# Patient Record
Sex: Male | Born: 1960 | Race: White | Hispanic: No | Marital: Married | Smoking: Never smoker
Health system: Southern US, Community
[De-identification: ages and names within clinical notes are randomized; demographics above are authoritative.]

## PROBLEM LIST (undated history)

## (undated) DIAGNOSIS — M1990 Unspecified osteoarthritis, unspecified site: Secondary | ICD-10-CM

## (undated) DIAGNOSIS — M545 Low back pain, unspecified: Secondary | ICD-10-CM

## (undated) DIAGNOSIS — I10 Essential (primary) hypertension: Secondary | ICD-10-CM

## (undated) DIAGNOSIS — H269 Unspecified cataract: Secondary | ICD-10-CM

## (undated) HISTORY — DX: Unspecified cataract: H26.9

## (undated) HISTORY — DX: Low back pain, unspecified: M54.50

## (undated) HISTORY — DX: Low back pain: M54.5

## (undated) HISTORY — DX: Essential (primary) hypertension: I10

## (undated) HISTORY — DX: Unspecified osteoarthritis, unspecified site: M19.90

---

## 1998-01-13 ENCOUNTER — Ambulatory Visit (HOSPITAL_COMMUNITY): Admission: RE | Admit: 1998-01-13 | Discharge: 1998-01-13 | Payer: Self-pay | Admitting: Internal Medicine

## 1998-01-13 ENCOUNTER — Encounter: Payer: Self-pay | Admitting: Internal Medicine

## 2004-09-15 ENCOUNTER — Ambulatory Visit: Payer: Self-pay | Admitting: Internal Medicine

## 2004-09-18 ENCOUNTER — Ambulatory Visit: Payer: Self-pay | Admitting: Internal Medicine

## 2005-06-15 ENCOUNTER — Ambulatory Visit: Payer: Self-pay | Admitting: Internal Medicine

## 2005-09-13 ENCOUNTER — Ambulatory Visit: Payer: Self-pay | Admitting: Internal Medicine

## 2005-12-14 ENCOUNTER — Ambulatory Visit: Payer: Self-pay | Admitting: Internal Medicine

## 2006-06-15 ENCOUNTER — Ambulatory Visit: Payer: Self-pay | Admitting: Internal Medicine

## 2006-06-15 LAB — CONVERTED CEMR LAB
ALT: 28 units/L (ref 0–40)
AST: 20 units/L (ref 0–37)
Albumin: 4.5 g/dL (ref 3.5–5.2)
Alkaline Phosphatase: 56 units/L (ref 39–117)
BUN: 23 mg/dL (ref 6–23)
Bilirubin, Direct: 0.1 mg/dL (ref 0.0–0.3)
CO2: 32 meq/L (ref 19–32)
Calcium: 9.4 mg/dL (ref 8.4–10.5)
Chloride: 106 meq/L (ref 96–112)
Creatinine, Ser: 0.8 mg/dL (ref 0.4–1.5)
GFR calc Af Amer: 135 mL/min
GFR calc non Af Amer: 112 mL/min
Glucose, Bld: 81 mg/dL (ref 70–99)
Potassium: 3.7 meq/L (ref 3.5–5.1)
Sodium: 141 meq/L (ref 135–145)
Total Bilirubin: 0.8 mg/dL (ref 0.3–1.2)
Total Protein: 7.6 g/dL (ref 6.0–8.3)
Vit D, 1,25-Dihydroxy: 58 — ABNORMAL HIGH (ref 20–57)

## 2006-12-10 ENCOUNTER — Encounter: Payer: Self-pay | Admitting: Internal Medicine

## 2007-01-16 ENCOUNTER — Ambulatory Visit: Payer: Self-pay | Admitting: Internal Medicine

## 2007-01-16 DIAGNOSIS — I10 Essential (primary) hypertension: Secondary | ICD-10-CM | POA: Insufficient documentation

## 2007-01-16 DIAGNOSIS — J302 Other seasonal allergic rhinitis: Secondary | ICD-10-CM | POA: Insufficient documentation

## 2007-01-16 DIAGNOSIS — J069 Acute upper respiratory infection, unspecified: Secondary | ICD-10-CM | POA: Insufficient documentation

## 2007-01-16 DIAGNOSIS — M1990 Unspecified osteoarthritis, unspecified site: Secondary | ICD-10-CM | POA: Insufficient documentation

## 2007-04-25 ENCOUNTER — Ambulatory Visit: Payer: Self-pay | Admitting: Internal Medicine

## 2007-07-11 ENCOUNTER — Ambulatory Visit: Payer: Self-pay | Admitting: Internal Medicine

## 2007-07-11 LAB — CONVERTED CEMR LAB
ALT: 19 units/L (ref 0–53)
AST: 17 units/L (ref 0–37)
Albumin: 4.5 g/dL (ref 3.5–5.2)
Alkaline Phosphatase: 52 units/L (ref 39–117)
BUN: 24 mg/dL — ABNORMAL HIGH (ref 6–23)
Bacteria, UA: NEGATIVE
Basophils Absolute: 0.1 10*3/uL (ref 0.0–0.1)
Basophils Relative: 1.1 % — ABNORMAL HIGH (ref 0.0–1.0)
Bilirubin Urine: NEGATIVE
Bilirubin, Direct: 0.1 mg/dL (ref 0.0–0.3)
CO2: 31 meq/L (ref 19–32)
Calcium: 9.6 mg/dL (ref 8.4–10.5)
Chloride: 106 meq/L (ref 96–112)
Cholesterol: 182 mg/dL (ref 0–200)
Creatinine, Ser: 1 mg/dL (ref 0.4–1.5)
Crystals: NEGATIVE
Direct LDL: 125.6 mg/dL
Eosinophils Absolute: 0.4 10*3/uL (ref 0.0–0.7)
Eosinophils Relative: 5.1 % — ABNORMAL HIGH (ref 0.0–5.0)
GFR calc Af Amer: 104 mL/min
GFR calc non Af Amer: 86 mL/min
Glucose, Bld: 114 mg/dL — ABNORMAL HIGH (ref 70–99)
HCT: 44.3 % (ref 39.0–52.0)
HDL: 32.5 mg/dL — ABNORMAL LOW (ref >39.0)
Hemoglobin, Urine: NEGATIVE
Hemoglobin: 15.3 g/dL (ref 13.0–17.0)
Ketones, ur: NEGATIVE mg/dL
Leukocytes, UA: NEGATIVE
Lymphocytes Relative: 30.4 % (ref 12.0–46.0)
MCHC: 34.6 g/dL (ref 30.0–36.0)
MCV: 88 fL (ref 78.0–100.0)
Monocytes Absolute: 0.7 10*3/uL (ref 0.1–1.0)
Monocytes Relative: 8.3 % (ref 3.0–12.0)
Mucus, UA: NEGATIVE
Neutro Abs: 4.6 10*3/uL (ref 1.4–7.7)
Neutrophils Relative %: 55.1 % (ref 43.0–77.0)
Nitrite: NEGATIVE
PSA: 1.02 ng/mL (ref 0.10–4.00)
Platelets: 254 10*3/uL (ref 150–400)
Potassium: 4.7 meq/L (ref 3.5–5.1)
RBC / HPF: NONE SEEN
RBC: 5.03 M/uL (ref 4.22–5.81)
RDW: 11.9 % (ref 11.5–14.6)
Sed Rate: 7 mm/hr (ref 0–16)
Sodium: 141 meq/L (ref 135–145)
Specific Gravity, Urine: >=1.03 (ref 1.000–1.03)
TSH: 1.18 microintl units/mL (ref 0.35–5.50)
Total Bilirubin: 0.9 mg/dL (ref 0.3–1.2)
Total CHOL/HDL Ratio: 5.6
Total Protein, Urine: NEGATIVE mg/dL
Total Protein: 7.5 g/dL (ref 6.0–8.3)
Triglycerides: 214 mg/dL (ref 0–149)
Urine Glucose: NEGATIVE mg/dL
Urobilinogen, UA: 0.2 (ref 0.0–1.0)
VLDL: 43 mg/dL — ABNORMAL HIGH (ref 0–40)
WBC: 8.4 10*3/uL (ref 4.5–10.5)
pH: 5.5 (ref 5.0–8.0)

## 2007-07-18 ENCOUNTER — Ambulatory Visit: Payer: Self-pay | Admitting: Internal Medicine

## 2007-07-18 DIAGNOSIS — R7309 Other abnormal glucose: Secondary | ICD-10-CM | POA: Insufficient documentation

## 2007-08-15 ENCOUNTER — Ambulatory Visit: Payer: Self-pay | Admitting: Internal Medicine

## 2007-08-15 DIAGNOSIS — J019 Acute sinusitis, unspecified: Secondary | ICD-10-CM | POA: Insufficient documentation

## 2008-01-15 ENCOUNTER — Ambulatory Visit: Payer: Self-pay | Admitting: Internal Medicine

## 2008-06-17 ENCOUNTER — Encounter: Payer: Self-pay | Admitting: Internal Medicine

## 2008-07-19 ENCOUNTER — Ambulatory Visit: Payer: Self-pay | Admitting: Internal Medicine

## 2008-07-19 LAB — CONVERTED CEMR LAB
ALT: 26 units/L (ref 0–53)
AST: 20 units/L (ref 0–37)
Albumin: 4.4 g/dL (ref 3.5–5.2)
Alkaline Phosphatase: 64 units/L (ref 39–117)
BUN: 26 mg/dL — ABNORMAL HIGH (ref 6–23)
Basophils Absolute: 0 10*3/uL (ref 0.0–0.1)
Basophils Relative: 0.3 % (ref 0.0–3.0)
Bilirubin Urine: NEGATIVE
Bilirubin, Direct: 0.1 mg/dL (ref 0.0–0.3)
CO2: 31 meq/L (ref 19–32)
Calcium: 9.4 mg/dL (ref 8.4–10.5)
Chloride: 108 meq/L (ref 96–112)
Cholesterol: 165 mg/dL (ref 0–200)
Creatinine, Ser: 1 mg/dL (ref 0.4–1.5)
Eosinophils Absolute: 0.4 10*3/uL (ref 0.0–0.7)
Eosinophils Relative: 5.6 % — ABNORMAL HIGH (ref 0.0–5.0)
GFR calc non Af Amer: 85.32 mL/min (ref >60)
Glucose, Bld: 90 mg/dL (ref 70–99)
HCT: 44.4 % (ref 39.0–52.0)
HDL: 35.8 mg/dL — ABNORMAL LOW (ref >39.00)
Hemoglobin, Urine: NEGATIVE
Hemoglobin: 15.6 g/dL (ref 13.0–17.0)
Ketones, ur: NEGATIVE mg/dL
LDL Cholesterol: 108 mg/dL — ABNORMAL HIGH (ref 0–99)
Leukocytes, UA: NEGATIVE
Lymphocytes Relative: 32.9 % (ref 12.0–46.0)
Lymphs Abs: 2.1 10*3/uL (ref 0.7–4.0)
MCHC: 35 g/dL (ref 30.0–36.0)
MCV: 88.2 fL (ref 78.0–100.0)
Monocytes Absolute: 0.6 10*3/uL (ref 0.1–1.0)
Monocytes Relative: 9.4 % (ref 3.0–12.0)
Neutro Abs: 3.4 10*3/uL (ref 1.4–7.7)
Neutrophils Relative %: 51.8 % (ref 43.0–77.0)
Nitrite: NEGATIVE
PSA: 0.76 ng/mL (ref 0.10–4.00)
Platelets: 242 10*3/uL (ref 150.0–400.0)
Potassium: 4.3 meq/L (ref 3.5–5.1)
RBC: 5.04 M/uL (ref 4.22–5.81)
RDW: 12.1 % (ref 11.5–14.6)
Sodium: 143 meq/L (ref 135–145)
Specific Gravity, Urine: >=1.03 (ref 1.000–1.030)
TSH: 0.55 microintl units/mL (ref 0.35–5.50)
Total Bilirubin: 1.1 mg/dL (ref 0.3–1.2)
Total CHOL/HDL Ratio: 5
Total Protein, Urine: NEGATIVE mg/dL
Total Protein: 7.3 g/dL (ref 6.0–8.3)
Triglycerides: 108 mg/dL (ref 0.0–149.0)
Urine Glucose: NEGATIVE mg/dL
Urobilinogen, UA: 0.2 (ref 0.0–1.0)
VLDL: 21.6 mg/dL (ref 0.0–40.0)
WBC: 6.5 10*3/uL (ref 4.5–10.5)
pH: 5 (ref 5.0–8.0)

## 2008-07-23 ENCOUNTER — Ambulatory Visit: Payer: Self-pay | Admitting: Internal Medicine

## 2009-01-28 ENCOUNTER — Ambulatory Visit: Payer: Self-pay | Admitting: Internal Medicine

## 2009-01-28 DIAGNOSIS — M545 Low back pain, unspecified: Secondary | ICD-10-CM | POA: Insufficient documentation

## 2009-07-30 ENCOUNTER — Ambulatory Visit: Payer: Self-pay | Admitting: Internal Medicine

## 2009-07-30 LAB — CONVERTED CEMR LAB
ALT: 23 units/L (ref 0–53)
AST: 20 units/L (ref 0–37)
Albumin: 4.7 g/dL (ref 3.5–5.2)
Alkaline Phosphatase: 55 units/L (ref 39–117)
BUN: 32 mg/dL — ABNORMAL HIGH (ref 6–23)
Basophils Absolute: 0 10*3/uL (ref 0.0–0.1)
Basophils Relative: 0.5 % (ref 0.0–3.0)
Bilirubin Urine: NEGATIVE
Bilirubin, Direct: 0.1 mg/dL (ref 0.0–0.3)
CO2: 29 meq/L (ref 19–32)
Calcium: 9.8 mg/dL (ref 8.4–10.5)
Chloride: 106 meq/L (ref 96–112)
Cholesterol: 173 mg/dL (ref 0–200)
Creatinine, Ser: 1 mg/dL (ref 0.4–1.5)
Eosinophils Absolute: 0.3 10*3/uL (ref 0.0–0.7)
Eosinophils Relative: 3.3 % (ref 0.0–5.0)
GFR calc non Af Amer: 87.98 mL/min (ref >60)
Glucose, Bld: 77 mg/dL (ref 70–99)
HCT: 45.1 % (ref 39.0–52.0)
HDL: 38.7 mg/dL — ABNORMAL LOW (ref >39.00)
Hemoglobin, Urine: NEGATIVE
Hemoglobin: 15.7 g/dL (ref 13.0–17.0)
Ketones, ur: NEGATIVE mg/dL
LDL Cholesterol: 106 mg/dL — ABNORMAL HIGH (ref 0–99)
Leukocytes, UA: NEGATIVE
Lymphocytes Relative: 28.4 % (ref 12.0–46.0)
Lymphs Abs: 2.4 10*3/uL (ref 0.7–4.0)
MCHC: 34.8 g/dL (ref 30.0–36.0)
MCV: 88.7 fL (ref 78.0–100.0)
Monocytes Absolute: 0.8 10*3/uL (ref 0.1–1.0)
Monocytes Relative: 9 % (ref 3.0–12.0)
Neutro Abs: 5.1 10*3/uL (ref 1.4–7.7)
Neutrophils Relative %: 58.8 % (ref 43.0–77.0)
Nitrite: NEGATIVE
PSA: 1.19 ng/mL (ref 0.10–4.00)
Platelets: 265 10*3/uL (ref 150.0–400.0)
Potassium: 4.5 meq/L (ref 3.5–5.1)
RBC: 5.08 M/uL (ref 4.22–5.81)
RDW: 13.1 % (ref 11.5–14.6)
Sodium: 142 meq/L (ref 135–145)
Specific Gravity, Urine: >=1.03 (ref 1.000–1.030)
TSH: 0.88 microintl units/mL (ref 0.35–5.50)
Total Bilirubin: 0.9 mg/dL (ref 0.3–1.2)
Total CHOL/HDL Ratio: 4
Total Protein, Urine: NEGATIVE mg/dL
Total Protein: 7.7 g/dL (ref 6.0–8.3)
Triglycerides: 140 mg/dL (ref 0.0–149.0)
Urine Glucose: NEGATIVE mg/dL
Urobilinogen, UA: 0.2 (ref 0.0–1.0)
VLDL: 28 mg/dL (ref 0.0–40.0)
WBC: 8.6 10*3/uL (ref 4.5–10.5)
pH: 5.5 (ref 5.0–8.0)

## 2009-08-05 ENCOUNTER — Ambulatory Visit: Payer: Self-pay | Admitting: Internal Medicine

## 2009-08-05 DIAGNOSIS — R5383 Other fatigue: Secondary | ICD-10-CM

## 2009-08-05 DIAGNOSIS — R5381 Other malaise: Secondary | ICD-10-CM | POA: Insufficient documentation

## 2010-02-05 ENCOUNTER — Ambulatory Visit: Payer: Self-pay | Admitting: Internal Medicine

## 2010-03-08 HISTORY — PX: EYE SURGERY: SHX253

## 2010-04-07 NOTE — Assessment & Plan Note (Signed)
Summary: 6 mos f/u // #/cd   Vital Signs:  Patient profile:   50 year old male Height:      67 inches Weight:      176 pounds BMI:     27.67 Temp:     98.8 degrees F oral Pulse rate:   80 / minute Pulse rhythm:   regular Resp:     16 per minute BP sitting:   130 / 92  (left arm) Cuff size:   regular  Vitals Entered By: Lanier Prude, CMA(AAMA) (February 05, 2010 8:26 AM) CC: 6 mo f/u  Is Patient Diabetic? No   CC:  6 mo f/u .  History of Present Illness: The patient presents for a follow up of hypertension, OA, LBP  Current Medications (verified): 1)  Tramadol Hcl 50 Mg  Tabs (Tramadol Hcl) .Marland Kitchen.. 1 or 2 Two Times A Day  As Needed 2)  Naprosyn 500 Mg Tabs (Naproxen) .Marland Kitchen.. 1 Two Times A Day Pc Prn 3)  Accupril 10 Mg Tabs (Quinapril Hcl) .Marland Kitchen.. 1 By Mouth Qd 4)  Vitamin D3 1000 Unit  Tabs (Cholecalciferol) .Marland Kitchen.. 1 By Mouth Daily  Allergies (verified): No Known Drug Allergies  Past History:  Past Surgical History: Last updated: 01/16/2007 None  Social History: Last updated: 07/18/2007 Occupation:  Married Never Smoked  Past Medical History:  Allergic rhinitis Hypertension Osteoarthritis Low back pain  Review of Systems  The patient denies fever, dyspnea on exertion, and abdominal pain.    Physical Exam  General:  Well-developed,well-nourished,in no acute distress; alert,appropriate and cooperative throughout examination Nose:  External nasal examination shows no deformity or inflammation. Nasal mucosa are pink and moist without lesions or exudates. Mouth:  Oral mucosa and oropharynx without lesions or exudates.  Teeth in good repair. Neck:  No deformities, masses, or tenderness noted. Lungs:  Normal respiratory effort, chest expands symmetrically. Lungs are clear to auscultation, no crackles or wheezes. Heart:  Normal rate and regular rhythm. S1 and S2 normal without gallop, murmur, click, rub or other extra sounds. Abdomen:  Bowel sounds positive,abdomen soft  and non-tender without masses, organomegaly or hernias noted. Msk:  No deformity or scoliosis noted of thoracic or lumbar spine.  Hands w/o OA deformities Lumbar-sacral spine is a little tender to palpation over paraspinal muscles and painfull with the ROM  Extremities:  No clubbing, cyanosis, edema, or deformity noted with normal full range of motion of all joints.   Neurologic:  No cranial nerve deficits noted. Station and gait are normal. Plantar reflexes are down-going bilaterally. DTRs are symmetrical throughout. Sensory, motor and coordinative functions appear intact. Skin:  Intact without suspicious lesions or rashes Psych:  Cognition and judgment appear intact. Alert and cooperative with normal attention span and concentration. No apparent delusions, illusions, hallucinations   Impression & Recommendations:  Problem # 1:  LOW BACK PAIN (ICD-724.2) Assessment Unchanged  His updated medication list for this problem includes:    Tramadol Hcl 50 Mg Tabs (Tramadol hcl) .Marland Kitchen... 1 or 2 two times a day  as needed    Naprosyn 500 Mg Tabs (Naproxen) .Marland Kitchen... 1 two times a day pc prn  Problem # 2:  OSTEOARTHRITIS (ICD-715.90) Assessment: Unchanged  His updated medication list for this problem includes:    Tramadol Hcl 50 Mg Tabs (Tramadol hcl) .Marland Kitchen... 1 or 2 two times a day  as needed    Naprosyn 500 Mg Tabs (Naproxen) .Marland Kitchen... 1 two times a day pc prn  Problem # 3:  HYPERTENSION (  ICD-401.9) - needs better control Assessment: Unchanged  The following medications were removed from the medication list:    Accupril 10 Mg Tabs (Quinapril hcl) .Marland Kitchen... 1 by mouth qd His updated medication list for this problem includes:    Accupril 20 Mg Tabs (Quinapril hcl) .Marland Kitchen... 1 by mouth qd  Complete Medication List: 1)  Tramadol Hcl 50 Mg Tabs (Tramadol hcl) .Marland Kitchen.. 1 or 2 two times a day  as needed 2)  Naprosyn 500 Mg Tabs (Naproxen) .Marland Kitchen.. 1 two times a day pc prn 3)  Vitamin D3 1000 Unit Tabs (Cholecalciferol) .Marland Kitchen..  1 by mouth daily 4)  Accupril 20 Mg Tabs (Quinapril hcl) .Marland Kitchen.. 1 by mouth qd  Patient Instructions: 1)  Please schedule a follow-up appointment in 6 months well w/labs. Prescriptions: NAPROSYN 500 MG TABS (NAPROXEN) 1 two times a day pc prn  #60 x 6   Entered and Authorized by:   Tresa Garter MD   Signed by:   Tresa Garter MD on 02/05/2010   Method used:   Print then Give to Patient   RxID:   [id number] TRAMADOL HCL 50 MG  TABS (TRAMADOL HCL) 1 or 2 two times a day  as needed  #120 x 6   Entered and Authorized by:   Tresa Garter MD   Signed by:   Tresa Garter MD on 02/05/2010   Method used:   Print then Give to Patient   RxID:   [id number] ACCUPRIL 20 MG TABS (QUINAPRIL HCL) 1 by mouth qd  #30 x 11   Entered and Authorized by:   Tresa Garter MD   Signed by:   Tresa Garter MD on 02/05/2010   Method used:   Print then Give to Patient   RxID:   [id number]    Orders Added: 1)  Est. Patient Level IV [01027]   Immunization History:  Tetanus/Td Immunization History:    Tetanus/Td:  historical (11/18/1999)   Immunization History:  Tetanus/Td Immunization History:    Tetanus/Td:  Historical (11/18/1999)

## 2010-04-07 NOTE — Assessment & Plan Note (Signed)
Summary: CPX / NWS  #   Vital Signs:  Patient profile:   50 year old male Height:      67 inches Weight:      1760.50 pounds BMI:     276.73 O2 Sat:      98 % on Room air Temp:     97.1 degrees F oral Pulse rate:   91 / minute BP sitting:   140 / 88  (left arm) Cuff size:   regular  Vitals Entered By: Lucious Groves (Aug 05, 2009 8:22 AM)  O2 Flow:  Room air CC: CPX./kb Is Patient Diabetic? No Pain Assessment Patient in pain? no      Comments Patient declined EKG and TD booster./kb   CC:  CPX./kb.  History of Present Illness: The patient presents for a wellness examination  F/u OA, stiff back C/o fatigue  Current Medications (verified): 1)  Tramadol Hcl 50 Mg  Tabs (Tramadol Hcl) .Marland Kitchen.. 1 or 2 Two Times A Day  As Needed 2)  Naprosyn 500 Mg Tabs (Naproxen) .Marland Kitchen.. 1 Two Times A Day Pc Prn 3)  Accupril 10 Mg Tabs (Quinapril Hcl) .Marland Kitchen.. 1 By Mouth Qd 4)  Vitamin D3 1000 Unit  Tabs (Cholecalciferol) .Marland Kitchen.. 1 By Mouth Daily  Allergies (verified): No Known Drug Allergies  Past History:  Past Medical History: Last updated: 01/28/2009   Allergic rhinitis Hypertension Osteoarthritis Low back pain  Past Surgical History: Last updated: 01/16/2007 None  Family History: Last updated: 01/16/2007 Family History Hypertension Family History Diabetes 1st degree relative  Social History: Last updated: 07/18/2007 Occupation:  Married Never Smoked  Review of Systems  The patient denies anorexia, fever, weight loss, weight gain, vision loss, decreased hearing, hoarseness, chest pain, syncope, dyspnea on exertion, peripheral edema, prolonged cough, headaches, hemoptysis, abdominal pain, melena, hematochezia, severe indigestion/heartburn, hematuria, incontinence, genital sores, muscle weakness, suspicious skin lesions, transient blindness, difficulty walking, depression, unusual weight change, abnormal bleeding, enlarged lymph nodes, angioedema, and testicular masses.     LBP, fatigue   Physical Exam  General:  Well-developed,well-nourished,in no acute distress; alert,appropriate and cooperative throughout examination Head:  Normocephalic and atraumatic without obvious abnormalities. No apparent alopecia or balding. Eyes:  No corneal or conjunctival inflammation noted. EOMI. Perrla. Ears:  External ear exam shows no significant lesions or deformities.  Otoscopic examination reveals clear canals, tympanic membranes are intact bilaterally without bulging, retraction, inflammation or discharge. Hearing is grossly normal bilaterally. Nose:  External nasal examination shows no deformity or inflammation. Nasal mucosa are pink and moist without lesions or exudates. Mouth:  Oral mucosa and oropharynx without lesions or exudates.  Teeth in good repair. Neck:  No deformities, masses, or tenderness noted. Chest Wall:  No deformities, masses, tenderness or gynecomastia noted. Lungs:  Normal respiratory effort, chest expands symmetrically. Lungs are clear to auscultation, no crackles or wheezes. Heart:  Normal rate and regular rhythm. S1 and S2 normal without gallop, murmur, click, rub or other extra sounds. Abdomen:  Bowel sounds positive,abdomen soft and non-tender without masses, organomegaly or hernias noted. Genitalia:  declined - nl per pt Msk:  No deformity or scoliosis noted of thoracic or lumbar spine.  Hands w/o OA deformities Lumbar-sacral spine is a little tender to palpation over paraspinal muscles and painfull with the ROM  Pulses:  R and L carotid,radial,femoral,dorsalis pedis and posterior tibial pulses are full and equal bilaterally Extremities:  No clubbing, cyanosis, edema, or deformity noted with normal full range of motion of all joints.   Neurologic:  No cranial nerve deficits noted. Station and gait are normal. Plantar reflexes are down-going bilaterally. DTRs are symmetrical throughout. Sensory, motor and coordinative functions appear intact. Skin:   Intact without suspicious lesions or rashes Cervical Nodes:  No lymphadenopathy noted Inguinal Nodes:  No significant adenopathy   Impression & Recommendations:  Problem # 1:  PHYSICAL EXAMINATION (ICD-V70.0) Assessment New Health and age related issues were discussed. Available screening tests and vaccinations were discussed as well. Healthy life style including good diet and execise was discussed.  The labs were reviewed with the patient.  Declined EKG, shots  Problem # 2:  FATIGUE (ICD-780.79) Assessment: New Try to take Accupril at hs or hold  Problem # 3:  OSTEOARTHRITIS (ICD-715.90) Assessment: Unchanged  His updated medication list for this problem includes:    Tramadol Hcl 50 Mg Tabs (Tramadol hcl) .Marland Kitchen... 1 or 2 two times a day  as needed    Naprosyn 500 Mg Tabs (Naproxen) .Marland Kitchen... 1 two times a day pc prn  Problem # 4:  LOW BACK PAIN (ICD-724.2) Assessment: Unchanged  His updated medication list for this problem includes:    Tramadol Hcl 50 Mg Tabs (Tramadol hcl) .Marland Kitchen... 1 or 2 two times a day  as needed    Naprosyn 500 Mg Tabs (Naproxen) .Marland Kitchen... 1 two times a day pc prn  Complete Medication List: 1)  Tramadol Hcl 50 Mg Tabs (Tramadol hcl) .Marland Kitchen.. 1 or 2 two times a day  as needed 2)  Naprosyn 500 Mg Tabs (Naproxen) .Marland Kitchen.. 1 two times a day pc prn 3)  Accupril 10 Mg Tabs (Quinapril hcl) .Marland Kitchen.. 1 by mouth qd 4)  Vitamin D3 1000 Unit Tabs (Cholecalciferol) .Marland Kitchen.. 1 by mouth daily  Patient Instructions: 1)  Use stretching and balance exercises that I have provided (15 min. or longer every day) 2)  Foam cilinder, yoga mat 3)  Please schedule a follow-up appointment in 6 months.  Contraindications/Deferment of Procedures/Staging:    Test/Procedure: TD vaccine    Reason for deferment: declined  Prescriptions: ACCUPRIL 10 MG TABS (QUINAPRIL HCL) 1 by mouth qd  #30 x 12   Entered and Authorized by:   Tresa Garter MD   Signed by:   Tresa Garter MD on 08/05/2009   Method  used:   Electronically to        [facility]  [address] [phone]* (retail)       [address]       [place], [place]  [address]       Ph: [phone] or [phone]       Fax: [phone]   RxID:   [id number] NAPROSYN 500 MG TABS (NAPROXEN) 1 two times a day pc prn  #60 x 6   Entered and Authorized by:   Tresa Garter MD   Signed by:   Tresa Garter MD on 08/05/2009   Method used:   Electronically to        [facility]  [address] [phone]* (retail)       [address]       [place], [place]  [address]       Ph: [phone] or [phone]       Fax: [phone]   RxID:   [id number] TRAMADOL HCL 50 MG  TABS (TRAMADOL HCL) 1 or 2 two times a day  as needed  #120 x 6  Entered and Authorized by:   Tresa Garter MD   Signed by:   Tresa Garter MD on 08/05/2009   Method used:   Electronically to        [facility]  [address] [phone]* (retail)       [address]       [place], [place]  [address]       Ph: [phone] or [phone]       Fax: [phone]   RxID:   [id number]    s

## 2010-08-04 ENCOUNTER — Other Ambulatory Visit: Payer: Self-pay | Admitting: Internal Medicine

## 2010-08-04 ENCOUNTER — Other Ambulatory Visit (INDEPENDENT_AMBULATORY_CARE_PROVIDER_SITE_OTHER): Payer: BC Managed Care – PPO

## 2010-08-04 DIAGNOSIS — Z0000 Encounter for general adult medical examination without abnormal findings: Secondary | ICD-10-CM

## 2010-08-04 LAB — LIPID PANEL
HDL: 42.9 mg/dL (ref >39.00)
Total CHOL/HDL Ratio: 4
Triglycerides: 132 mg/dL (ref 0.0–149.0)
VLDL: 26.4 mg/dL (ref 0.0–40.0)

## 2010-08-04 LAB — URINALYSIS
Bilirubin Urine: NEGATIVE
Hgb urine dipstick: NEGATIVE
Ketones, ur: NEGATIVE
Leukocytes, UA: NEGATIVE
Nitrite: NEGATIVE
pH: 5.5 (ref 5.0–8.0)

## 2010-08-04 LAB — BASIC METABOLIC PANEL
BUN: 26 mg/dL — ABNORMAL HIGH (ref 6–23)
CO2: 28 mEq/L (ref 19–32)
Calcium: 9.6 mg/dL (ref 8.4–10.5)
GFR: 81.74 mL/min (ref >60.00)
Glucose, Bld: 91 mg/dL (ref 70–99)
Potassium: 4.7 mEq/L (ref 3.5–5.1)
Sodium: 140 mEq/L (ref 135–145)

## 2010-08-04 LAB — CBC WITH DIFFERENTIAL/PLATELET
Basophils Absolute: 0 10*3/uL (ref 0.0–0.1)
Eosinophils Absolute: 0.7 10*3/uL (ref 0.0–0.7)
Lymphocytes Relative: 26.6 % (ref 12.0–46.0)
MCHC: 34.3 g/dL (ref 30.0–36.0)
Monocytes Relative: 7.6 % (ref 3.0–12.0)
Neutrophils Relative %: 58.1 % (ref 43.0–77.0)
RBC: 5.26 Mil/uL (ref 4.22–5.81)
RDW: 13.3 % (ref 11.5–14.6)

## 2010-08-04 LAB — HEPATIC FUNCTION PANEL
AST: 17 U/L (ref 0–37)
Albumin: 4.3 g/dL (ref 3.5–5.2)
Alkaline Phosphatase: 50 U/L (ref 39–117)
Total Protein: 7 g/dL (ref 6.0–8.3)

## 2010-08-04 LAB — TSH: TSH: 0.98 u[IU]/mL (ref 0.35–5.50)

## 2010-08-07 ENCOUNTER — Encounter: Payer: Self-pay | Admitting: Internal Medicine

## 2010-08-10 ENCOUNTER — Encounter: Payer: Self-pay | Admitting: Internal Medicine

## 2010-08-10 ENCOUNTER — Ambulatory Visit (INDEPENDENT_AMBULATORY_CARE_PROVIDER_SITE_OTHER): Payer: BC Managed Care – PPO | Admitting: Internal Medicine

## 2010-08-10 VITALS — BP 130/90 | HR 92 | Resp 16 | Ht 67.0 in | Wt 176.0 lb

## 2010-08-10 DIAGNOSIS — Z0000 Encounter for general adult medical examination without abnormal findings: Secondary | ICD-10-CM

## 2010-08-10 DIAGNOSIS — I10 Essential (primary) hypertension: Secondary | ICD-10-CM

## 2010-08-10 MED ORDER — QUINAPRIL-HYDROCHLOROTHIAZIDE 20-12.5 MG PO TABS: 1.0000 | ORAL_TABLET | Freq: Every day | ORAL | Status: DC

## 2010-08-10 MED ORDER — TRAMADOL HCL 50 MG PO TABS: 50.0000 mg | ORAL_TABLET | Freq: Two times a day (BID) | ORAL | Status: DC | PRN

## 2010-08-10 MED ORDER — NAPROXEN 500 MG PO TABS: 500.0000 mg | ORAL_TABLET | Freq: Two times a day (BID) | ORAL | Status: DC

## 2010-08-10 NOTE — Progress Notes (Signed)
  Subjective:    Patient ID: Jeremy Johns, male    DOB: [birth year], 50 y.o.   MRN: [id number]  HPI   The patient is here for a wellness exam. The patient has been doing well overall without major physical or psychological issues going on lately. Review of Systems  Constitutional: Negative for appetite change, fatigue and unexpected weight change.  HENT: Negative for nosebleeds, congestion, sore throat, sneezing, trouble swallowing and neck pain.   Eyes: Negative for itching and visual disturbance.  Respiratory: Negative for cough.   Cardiovascular: Negative for chest pain, palpitations and leg swelling.  Gastrointestinal: Negative for nausea, diarrhea, blood in stool and abdominal distention.  Genitourinary: Negative for frequency and hematuria.  Musculoskeletal: Positive for back pain and arthralgias. Negative for joint swelling and gait problem.  Skin: Negative for rash.  Neurological: Negative for dizziness, tremors, speech difficulty and weakness.  Psychiatric/Behavioral: Negative for sleep disturbance, dysphoric mood and agitation. The patient is not nervous/anxious.    BP Readings from Last 3 Encounters:  08/10/10 130/90  02/05/10 130/92  08/05/09 140/88       Objective:   Physical Exam  Constitutional: He is oriented to person, place, and time. He appears well-developed and well-nourished. No distress.  HENT:  Head: Normocephalic and atraumatic.  Right Ear: External ear normal.  Left Ear: External ear normal.  Nose: Nose normal.  Mouth/Throat: Oropharynx is clear and moist. No oropharyngeal exudate.  Eyes: Conjunctivae and EOM are normal. Pupils are equal, round, and reactive to light. Right eye exhibits no discharge. Left eye exhibits no discharge. No scleral icterus.  Neck: Normal range of motion. Neck supple. No JVD present. No tracheal deviation present. No thyromegaly present.  Cardiovascular: Normal rate, regular rhythm, normal heart sounds and intact distal  pulses.  Exam reveals no gallop and no friction rub.   No murmur heard. Pulmonary/Chest: Effort normal and breath sounds normal. No stridor. No respiratory distress. He has no wheezes. He has no rales. He exhibits no tenderness.  Abdominal: Soft. Bowel sounds are normal. He exhibits no distension and no mass. There is no tenderness. There is no rebound and no guarding.  Genitourinary: Penis normal. Guaiac negative stool. No penile tenderness.  Musculoskeletal: Normal range of motion. He exhibits no edema and no tenderness.  Lymphadenopathy:    He has no cervical adenopathy.  Neurological: He is alert and oriented to person, place, and time. He has normal reflexes. No cranial nerve deficit. He exhibits normal muscle tone. Coordination normal.  Skin: Skin is warm and dry. No rash noted. He is not diaphoretic. No erythema. No pallor.  Psychiatric: He has a normal mood and affect. His behavior is normal. Judgment and thought content normal.         Lab Results  Component Value Date   WBC 9.5 08/04/2010   HGB 16.1 08/04/2010   HCT 46.9 08/04/2010   PLT 262.0 08/04/2010   CHOL 178 08/04/2010   TRIG 132.0 08/04/2010   HDL 42.90 08/04/2010   LDLDIRECT 125.6 07/11/2007   ALT 20 08/04/2010   AST 17 08/04/2010   NA 140 08/04/2010   K 4.7 08/04/2010   CL 106 08/04/2010   CREATININE 1.0 08/04/2010   BUN 26* 08/04/2010   CO2 28 08/04/2010   TSH 0.98 08/04/2010   PSA 1.19 07/30/2009    Assessment & Plan:

## 2010-08-10 NOTE — Assessment & Plan Note (Signed)
We discussed age appropriate health related issues, including available/recomended screening tests and vaccinations. We discussed a need for adhering to healthy diet and exercise. Labs/EKG were reviewed/ordered. All questions were answered.   

## 2011-02-02 ENCOUNTER — Other Ambulatory Visit (INDEPENDENT_AMBULATORY_CARE_PROVIDER_SITE_OTHER): Payer: BC Managed Care – PPO

## 2011-02-02 DIAGNOSIS — I10 Essential (primary) hypertension: Secondary | ICD-10-CM

## 2011-02-02 LAB — COMPREHENSIVE METABOLIC PANEL
ALT: 37 U/L (ref 0–53)
AST: 27 U/L (ref 0–37)
Albumin: 4.4 g/dL (ref 3.5–5.2)
CO2: 28 mEq/L (ref 19–32)
Calcium: 9.7 mg/dL (ref 8.4–10.5)
Chloride: 103 mEq/L (ref 96–112)
GFR: 81.57 mL/min (ref >60.00)
Potassium: 3.8 mEq/L (ref 3.5–5.1)
Total Protein: 7.9 g/dL (ref 6.0–8.3)

## 2011-02-08 ENCOUNTER — Ambulatory Visit (INDEPENDENT_AMBULATORY_CARE_PROVIDER_SITE_OTHER): Payer: Managed Care, Other (non HMO) | Admitting: Internal Medicine

## 2011-02-08 ENCOUNTER — Encounter: Payer: Self-pay | Admitting: Internal Medicine

## 2011-02-08 DIAGNOSIS — M1990 Unspecified osteoarthritis, unspecified site: Secondary | ICD-10-CM

## 2011-02-08 DIAGNOSIS — I10 Essential (primary) hypertension: Secondary | ICD-10-CM

## 2011-02-08 DIAGNOSIS — M545 Low back pain, unspecified: Secondary | ICD-10-CM

## 2011-02-08 MED ORDER — TRAMADOL HCL 50 MG PO TABS: 50.0000 mg | ORAL_TABLET | Freq: Two times a day (BID) | ORAL | Status: DC | PRN

## 2011-02-08 MED ORDER — QUINAPRIL-HYDROCHLOROTHIAZIDE 20-12.5 MG PO TABS: 1.0000 | ORAL_TABLET | Freq: Every day | ORAL | Status: DC

## 2011-02-08 NOTE — Assessment & Plan Note (Signed)
Continue with current prescription therapy as reflected on the Med list.  

## 2011-02-08 NOTE — Progress Notes (Signed)
  Subjective:    Patient ID: Jeremy Johns, male    DOB: 1960-08-29, 50 y.o.   MRN: [id number]  HPI  The patient presents for a follow-up of  chronic hypertension, OA, LBP controlled with medicines    Review of Systems  Constitutional: Negative for chills.  Respiratory: Negative for shortness of breath.   Musculoskeletal: Positive for back pain and arthralgias. Negative for gait problem.  Skin: Negative for rash.  Neurological: Negative for numbness.  Psychiatric/Behavioral: Negative for confusion. The patient is not nervous/anxious.        Objective:   Physical Exam  Constitutional: He is oriented to person, place, and time. He appears well-developed.  HENT:  Mouth/Throat: Oropharynx is clear and moist.  Eyes: Conjunctivae are normal. Pupils are equal, round, and reactive to light.  Neck: Normal range of motion. No JVD present. No thyromegaly present.  Cardiovascular: Normal rate, regular rhythm, normal heart sounds and intact distal pulses.  Exam reveals no gallop and no friction rub.   No murmur heard. Pulmonary/Chest: Effort normal and breath sounds normal. No respiratory distress. He has no wheezes. He has no rales. He exhibits no tenderness.  Abdominal: Soft. Bowel sounds are normal. He exhibits no distension and no mass. There is no tenderness. There is no rebound and no guarding.  Musculoskeletal: Normal range of motion. He exhibits no edema and no tenderness.  Lymphadenopathy:    He has no cervical adenopathy.  Neurological: He is alert and oriented to person, place, and time. He has normal reflexes. No cranial nerve deficit. He exhibits normal muscle tone. Coordination normal.  Skin: Skin is warm and dry. No rash noted.  Psychiatric: He has a normal mood and affect. His behavior is normal. Judgment and thought content normal.  LS tender        Assessment & Plan:

## 2011-08-10 ENCOUNTER — Encounter: Payer: Self-pay | Admitting: Internal Medicine

## 2011-08-10 ENCOUNTER — Telehealth: Payer: Self-pay | Admitting: *Deleted

## 2011-08-10 ENCOUNTER — Ambulatory Visit (INDEPENDENT_AMBULATORY_CARE_PROVIDER_SITE_OTHER): Payer: Managed Care, Other (non HMO) | Admitting: Internal Medicine

## 2011-08-10 ENCOUNTER — Other Ambulatory Visit (INDEPENDENT_AMBULATORY_CARE_PROVIDER_SITE_OTHER): Payer: Managed Care, Other (non HMO)

## 2011-08-10 VITALS — BP 130/90 | HR 84 | Temp 98.6°F | Resp 16 | Wt 176.0 lb

## 2011-08-10 DIAGNOSIS — I10 Essential (primary) hypertension: Secondary | ICD-10-CM

## 2011-08-10 DIAGNOSIS — R7309 Other abnormal glucose: Secondary | ICD-10-CM

## 2011-08-10 DIAGNOSIS — M1990 Unspecified osteoarthritis, unspecified site: Secondary | ICD-10-CM

## 2011-08-10 DIAGNOSIS — Z125 Encounter for screening for malignant neoplasm of prostate: Secondary | ICD-10-CM

## 2011-08-10 DIAGNOSIS — M545 Low back pain, unspecified: Secondary | ICD-10-CM

## 2011-08-10 DIAGNOSIS — Z0000 Encounter for general adult medical examination without abnormal findings: Secondary | ICD-10-CM

## 2011-08-10 LAB — BASIC METABOLIC PANEL
BUN: 21 mg/dL (ref 6–23)
CO2: 29 mEq/L (ref 19–32)
Calcium: 9.5 mg/dL (ref 8.4–10.5)
Creatinine, Ser: 0.9 mg/dL (ref 0.4–1.5)

## 2011-08-10 MED ORDER — TRAMADOL HCL 50 MG PO TABS: 50.0000 mg | ORAL_TABLET | Freq: Two times a day (BID) | ORAL | Status: DC | PRN

## 2011-08-10 MED ORDER — QUINAPRIL-HYDROCHLOROTHIAZIDE 20-12.5 MG PO TABS: 1.0000 | ORAL_TABLET | Freq: Every day | ORAL | Status: DC

## 2011-08-10 NOTE — Telephone Encounter (Signed)
Received staff msg pt made cpx for dec need labs entered... 08/10/11@9 :36am/LMB

## 2011-08-10 NOTE — Assessment & Plan Note (Signed)
Continue with current prescription therapy as reflected on the Med list.  

## 2011-08-10 NOTE — Progress Notes (Signed)
Patient ID: Jeremy Johns, male   DOB: 1960/07/16, 51 y.o.   MRN: [id number]  Subjective:    Patient ID: Jeremy Johns, male    DOB: 12/14/1960, 51 y.o.   MRN: [id number]  HPI  The patient presents for a follow-up of  chronic hypertension, elev glucose, OA, LBP controlled with medicines  Wt Readings from Last 3 Encounters:  08/10/11 176 lb (79.833 kg)  02/08/11 177 lb (80.287 kg)  08/10/10 176 lb (79.833 kg)   BP Readings from Last 3 Encounters:  08/10/11 130/90  02/08/11 120/64  08/10/10 130/90      Review of Systems  Constitutional: Negative for chills.  Respiratory: Negative for shortness of breath.   Musculoskeletal: Positive for back pain and arthralgias. Negative for gait problem.  Skin: Negative for rash.  Neurological: Negative for numbness.  Psychiatric/Behavioral: Negative for confusion. The patient is not nervous/anxious.        Objective:   Physical Exam  Constitutional: He is oriented to person, place, and time. He appears well-developed.  HENT:  Mouth/Throat: Oropharynx is clear and moist.  Eyes: Conjunctivae are normal. Pupils are equal, round, and reactive to light.  Neck: Normal range of motion. No JVD present. No thyromegaly present.  Cardiovascular: Normal rate, regular rhythm, normal heart sounds and intact distal pulses.  Exam reveals no gallop and no friction rub.   No murmur heard. Pulmonary/Chest: Effort normal and breath sounds normal. No respiratory distress. He has no wheezes. He has no rales. He exhibits no tenderness.  Abdominal: Soft. Bowel sounds are normal. He exhibits no distension and no mass. There is no tenderness. There is no rebound and no guarding.  Musculoskeletal: Normal range of motion. He exhibits no edema and no tenderness.  Lymphadenopathy:    He has no cervical adenopathy.  Neurological: He is alert and oriented to person, place, and time. He has normal reflexes. No cranial nerve deficit. He exhibits normal muscle tone.  Coordination normal.  Skin: Skin is warm and dry. No rash noted.  Psychiatric: He has a normal mood and affect. His behavior is normal. Judgment and thought content normal.  LS tender  Lab Results  Component Value Date   WBC 9.5 08/04/2010   HGB 16.1 08/04/2010   HCT 46.9 08/04/2010   PLT 262.0 08/04/2010   GLUCOSE 85 02/02/2011   CHOL 178 08/04/2010   TRIG 132.0 08/04/2010   HDL 42.90 08/04/2010   LDLDIRECT 125.6 07/11/2007   LDLCALC 109* 08/04/2010   ALT 37 02/02/2011   AST 27 02/02/2011   NA 138 02/02/2011   K 3.8 02/02/2011   CL 103 02/02/2011   CREATININE 1.0 02/02/2011   BUN 21 02/02/2011   CO2 28 02/02/2011   TSH 0.98 08/04/2010   PSA 1.19 07/30/2009         Assessment & Plan:

## 2011-08-10 NOTE — Assessment & Plan Note (Signed)
Watching labs 

## 2011-08-10 NOTE — Assessment & Plan Note (Signed)
Continue with current prescription therapy as reflected on the Med list. Labs pending 

## 2011-08-10 NOTE — Telephone Encounter (Signed)
Message copied by Deatra James on Tue Aug 10, 2011  9:36 AM ------      Message from: Etheleen Sia      Created: Tue Aug 10, 2011  8:57 AM      Regarding: LABS       DEC PHYSICAL

## 2012-01-10 ENCOUNTER — Other Ambulatory Visit (INDEPENDENT_AMBULATORY_CARE_PROVIDER_SITE_OTHER): Payer: Managed Care, Other (non HMO)

## 2012-01-10 DIAGNOSIS — Z125 Encounter for screening for malignant neoplasm of prostate: Secondary | ICD-10-CM

## 2012-01-10 DIAGNOSIS — Z0000 Encounter for general adult medical examination without abnormal findings: Secondary | ICD-10-CM

## 2012-01-10 LAB — HEPATIC FUNCTION PANEL
ALT: 23 U/L (ref 0–53)
AST: 18 U/L (ref 0–37)
Alkaline Phosphatase: 46 U/L (ref 39–117)
Bilirubin, Direct: 0.1 mg/dL (ref 0.0–0.3)
Total Bilirubin: 0.8 mg/dL (ref 0.3–1.2)

## 2012-01-10 LAB — URINALYSIS, ROUTINE W REFLEX MICROSCOPIC
Hgb urine dipstick: NEGATIVE
Ketones, ur: NEGATIVE
Total Protein, Urine: NEGATIVE
Urine Glucose: NEGATIVE
Urobilinogen, UA: 0.2 (ref 0.0–1.0)

## 2012-01-10 LAB — CBC WITH DIFFERENTIAL/PLATELET
Basophils Absolute: 0 10*3/uL (ref 0.0–0.1)
Eosinophils Relative: 1.6 % (ref 0.0–5.0)
HCT: 49.2 % (ref 39.0–52.0)
Lymphocytes Relative: 30.7 % (ref 12.0–46.0)
Lymphs Abs: 2.5 10*3/uL (ref 0.7–4.0)
Monocytes Relative: 6.9 % (ref 3.0–12.0)
Neutrophils Relative %: 60.5 % (ref 43.0–77.0)
Platelets: 273 10*3/uL (ref 150.0–400.0)
RDW: 13 % (ref 11.5–14.6)
WBC: 8 10*3/uL (ref 4.5–10.5)

## 2012-01-10 LAB — BASIC METABOLIC PANEL
BUN: 18 mg/dL (ref 6–23)
CO2: 28 mEq/L (ref 19–32)
Calcium: 9.8 mg/dL (ref 8.4–10.5)
Creatinine, Ser: 1 mg/dL (ref 0.4–1.5)
GFR: 84.08 mL/min (ref >60.00)
Glucose, Bld: 87 mg/dL (ref 70–99)

## 2012-01-10 LAB — LIPID PANEL
HDL: 39.5 mg/dL (ref >39.00)
LDL Cholesterol: 126 mg/dL — ABNORMAL HIGH (ref 0–99)
Total CHOL/HDL Ratio: 5

## 2012-01-13 ENCOUNTER — Ambulatory Visit (INDEPENDENT_AMBULATORY_CARE_PROVIDER_SITE_OTHER): Payer: Managed Care, Other (non HMO) | Admitting: Internal Medicine

## 2012-01-13 ENCOUNTER — Encounter: Payer: Self-pay | Admitting: Internal Medicine

## 2012-01-13 VITALS — BP 120/72 | HR 76 | Temp 97.5°F | Resp 16 | Ht 67.0 in | Wt 176.0 lb

## 2012-01-13 DIAGNOSIS — I10 Essential (primary) hypertension: Secondary | ICD-10-CM

## 2012-01-13 DIAGNOSIS — M1990 Unspecified osteoarthritis, unspecified site: Secondary | ICD-10-CM

## 2012-01-13 DIAGNOSIS — M545 Low back pain, unspecified: Secondary | ICD-10-CM

## 2012-01-13 DIAGNOSIS — R7309 Other abnormal glucose: Secondary | ICD-10-CM

## 2012-01-13 DIAGNOSIS — Z0000 Encounter for general adult medical examination without abnormal findings: Secondary | ICD-10-CM

## 2012-01-13 MED ORDER — QUINAPRIL-HYDROCHLOROTHIAZIDE 20-12.5 MG PO TABS: 1.0000 | ORAL_TABLET | Freq: Every day | ORAL | Status: DC

## 2012-01-13 MED ORDER — TRAMADOL HCL 50 MG PO TABS: 50.0000 mg | ORAL_TABLET | Freq: Two times a day (BID) | ORAL | Status: DC | PRN

## 2012-01-13 NOTE — Assessment & Plan Note (Signed)
except

## 2012-01-13 NOTE — Assessment & Plan Note (Signed)
Continue with current prescription therapy as reflected on the Med list.  

## 2012-01-13 NOTE — Progress Notes (Signed)
  Subjective:    Patient ID: Jeremy Johns, male    DOB: 11-Mar-1960, 51 y.o.   MRN: [id number]  HPI   The patient is here for a wellness exam. The patient has been doing well overall without major physical or psychological issues going on lately. He is moving to [facility] soon  Review of Systems  Constitutional: Negative for appetite change, fatigue and unexpected weight change.  HENT: Negative for nosebleeds, congestion, sore throat, sneezing, trouble swallowing and neck pain.   Eyes: Negative for itching and visual disturbance.  Respiratory: Negative for cough.   Cardiovascular: Negative for chest pain, palpitations and leg swelling.  Gastrointestinal: Negative for nausea, diarrhea, blood in stool and abdominal distention.  Genitourinary: Negative for frequency and hematuria.  Musculoskeletal: Positive for back pain and arthralgias. Negative for joint swelling and gait problem.  Skin: Negative for rash.  Neurological: Negative for dizziness, tremors, speech difficulty and weakness.  Psychiatric/Behavioral: Negative for sleep disturbance, dysphoric mood and agitation. The patient is not nervous/anxious.    BP Readings from Last 3 Encounters:  01/13/12 120/72  08/10/11 130/90  02/08/11 120/64       Objective:   Physical Exam  Constitutional: He is oriented to person, place, and time. He appears well-developed and well-nourished. No distress.  HENT:  Head: Normocephalic and atraumatic.  Right Ear: External ear normal.  Left Ear: External ear normal.  Nose: Nose normal.  Mouth/Throat: Oropharynx is clear and moist. No oropharyngeal exudate.  Eyes: Conjunctivae normal and EOM are normal. Pupils are equal, round, and reactive to light. Right eye exhibits no discharge. Left eye exhibits no discharge. No scleral icterus.  Neck: Normal range of motion. Neck supple. No JVD present. No tracheal deviation present. No thyromegaly present.  Cardiovascular: Normal rate, regular rhythm, normal  heart sounds and intact distal pulses.  Exam reveals no gallop and no friction rub.   No murmur heard. Pulmonary/Chest: Effort normal and breath sounds normal. No stridor. No respiratory distress. He has no wheezes. He has no rales. He exhibits no tenderness.  Abdominal: Soft. Bowel sounds are normal. He exhibits no distension and no mass. There is no tenderness. There is no rebound and no guarding.  Genitourinary: Penis normal. Guaiac negative stool. No penile tenderness.  Musculoskeletal: Normal range of motion. He exhibits no edema and no tenderness.  Lymphadenopathy:    He has no cervical adenopathy.  Neurological: He is alert and oriented to person, place, and time. He has normal reflexes. No cranial nerve deficit. He exhibits normal muscle tone. Coordination normal.  Skin: Skin is warm and dry. No rash noted. He is not diaphoretic. No erythema. No pallor.  Psychiatric: He has a normal mood and affect. His behavior is normal. Judgment and thought content normal.         Lab Results  Component Value Date   WBC 8.0 01/10/2012   HGB 16.4 01/10/2012   HCT 49.2 01/10/2012   PLT 273.0 01/10/2012   CHOL 190 01/10/2012   TRIG 124.0 01/10/2012   HDL 39.50 01/10/2012   LDLDIRECT 125.6 07/11/2007   ALT 23 01/10/2012   AST 18 01/10/2012   NA 138 01/10/2012   K 4.3 01/10/2012   CL 102 01/10/2012   CREATININE 1.0 01/10/2012   BUN 18 01/10/2012   CO2 28 01/10/2012   TSH 1.24 01/10/2012   PSA 2.04 01/10/2012    Assessment & Plan:

## 2012-01-13 NOTE — Assessment & Plan Note (Addendum)
We discussed age appropriate health related issues, including available/recomended screening tests and vaccinations. We discussed a need for adhering to healthy diet and exercise. Labs/EKG were reviewed/ordered. All questions were answered. Declined shots 

## 2012-02-16 ENCOUNTER — Ambulatory Visit: Payer: Managed Care, Other (non HMO) | Admitting: Internal Medicine

## 2012-07-14 ENCOUNTER — Ambulatory Visit: Payer: Managed Care, Other (non HMO) | Admitting: Internal Medicine

## 2012-12-03 ENCOUNTER — Other Ambulatory Visit: Payer: Self-pay | Admitting: Internal Medicine

## 2012-12-04 NOTE — Telephone Encounter (Signed)
Pt called again.  Only has 2 pills left.  States it will cause seizures if he stops.

## 2017-11-15 ENCOUNTER — Encounter: Payer: Self-pay | Admitting: Internal Medicine

## 2017-11-15 ENCOUNTER — Ambulatory Visit (INDEPENDENT_AMBULATORY_CARE_PROVIDER_SITE_OTHER): Payer: 59 | Admitting: Internal Medicine

## 2017-11-15 ENCOUNTER — Encounter (INDEPENDENT_AMBULATORY_CARE_PROVIDER_SITE_OTHER): Payer: Self-pay

## 2017-11-15 VITALS — BP 128/88 | HR 86 | Temp 98.0°F | Wt 183.0 lb

## 2017-11-15 DIAGNOSIS — M47816 Spondylosis without myelopathy or radiculopathy, lumbar region: Secondary | ICD-10-CM

## 2017-11-15 DIAGNOSIS — J302 Other seasonal allergic rhinitis: Secondary | ICD-10-CM

## 2017-11-15 DIAGNOSIS — I10 Essential (primary) hypertension: Secondary | ICD-10-CM

## 2017-11-15 DIAGNOSIS — R5383 Other fatigue: Secondary | ICD-10-CM

## 2017-11-15 NOTE — Assessment & Plan Note (Signed)
Continue Ibuprofen as needed. 

## 2017-11-15 NOTE — Assessment & Plan Note (Signed)
Continue OTC antihistamine as needed 

## 2017-11-15 NOTE — Progress Notes (Signed)
HPI  Pt presents to the clinic today to establish care and for management of the conditions listed below. He is transferring care from [facility] at [facility].   Seasonal Allergies: Worse during the spring and fall. He takes an antihistamine OTC as needed.  HTN: His BP today is 128/88. He is not taking any antihypertensive medications at this time. There is no ECG on file.  OA: Mainly in his low back. He takes Ibuprofen as needed with good relief.  He is concerned about fatigue. He reports he has been told in the past that his testosterone is low but he reports he has never had this supplemented. He denies sexual dysfunction. He would like to have this checked today.  Flu: never Tetanus: > 10 years ago PSA Screening: never Colon Screening: never Vision Screening: as needed Dentist: biannually  Past Medical History:  Diagnosis Date  . Allergic rhinitis   . HTN (hypertension)   . LBP (low back pain)   . Osteoarthritis     No current outpatient medications on file.   No current facility-administered medications for this visit.     No Known Allergies  Family History  Problem Relation Age of Onset  . Diabetes Mother   . Hypertension Mother   . Diabetes Father   . Hypertension Father   . Hypertension Unknown   . Diabetes Unknown     Social History   Socioeconomic History  . Marital status: Married    Spouse name: Not on file  . Number of children: Not on file  . Years of education: Not on file  . Highest education level: Not on file  Occupational History  . Not on file  Social Needs  . Financial resource strain: Not on file  . Food insecurity:    Worry: Not on file    Inability: Not on file  . Transportation needs:    Medical: Not on file    Non-medical: Not on file  Tobacco Use  . Smoking status: Never Smoker  . Smokeless tobacco: Never Used  Substance and Sexual Activity  . Alcohol use: No  . Drug use: No  . Sexual activity: Yes  Lifestyle  .  Physical activity:    Days per week: Not on file    Minutes per session: Not on file  . Stress: Not on file  Relationships  . Social connections:    Talks on phone: Not on file    Gets together: Not on file    Attends religious service: Not on file    Active member of club or organization: Not on file    Attends meetings of clubs or organizations: Not on file    Relationship status: Not on file  . Intimate partner violence:    Fear of current or ex partner: Not on file    Emotionally abused: Not on file    Physically abused: Not on file    Forced sexual activity: Not on file  Other Topics Concern  . Not on file  Social History Narrative  . Not on file    ROS:  Constitutional: Pt reports fatigue. Denies fever, malaise, headache or abrupt weight changes.  HEENT: Denies eye pain, eye redness, ear pain, ringing in the ears, wax buildup, runny nose, nasal congestion, bloody nose, or sore throat. Respiratory: Denies difficulty breathing, shortness of breath, cough or sputum production.   Cardiovascular: Denies chest pain, chest tightness, palpitations or swelling in the hands or feet.  Gastrointestinal: Denies abdominal  pain, bloating, constipation, diarrhea or blood in the stool.  GU: Denies frequency, urgency, pain with urination, blood in urine, odor or discharge. Musculoskeletal: Denies decrease in range of motion, difficulty with gait, muscle pain or joint pain and swelling.  Skin: Denies redness, rashes, lesions or ulcercations.  Neurological: Denies dizziness, difficulty with memory, difficulty with speech or problems with balance and coordination.  Psych: Denies anxiety, depression, SI/HI.  No other specific complaints in a complete review of systems (except as listed in HPI above).  PE:  BP 128/88   Pulse 86   Temp 98 F (36.7 C) (Oral)   Wt 183 lb (83 kg)   SpO2 98%   BMI 28.66 kg/m  Wt Readings from Last 3 Encounters:  11/15/17 183 lb (83 kg)  01/13/12 176 lb  (79.8 kg)  08/10/11 176 lb (79.8 kg)    General: Appears his stated age, well developed, well nourished in NAD. Skin: Dry and intact. Cardiovascular: Normal rate and rhythm. S1,S2 noted.  No murmur, rubs or gallops noted.  Pulmonary/Chest: Normal effort and positive vesicular breath sounds. No respiratory distress. No wheezes, rales or ronchi noted.  Neurological: Alert and oriented.  Psychiatric: Mood and affect normal. Behavior is normal. Judgment and thought content normal.   EKG:  BMET    Component Value Date/Time   NA 138 01/10/2012 0841   K 4.3 01/10/2012 0841   CL 102 01/10/2012 0841   CO2 28 01/10/2012 0841   GLUCOSE 87 01/10/2012 0841   BUN 18 01/10/2012 0841   CREATININE 1.0 01/10/2012 0841   CALCIUM 9.8 01/10/2012 0841   GFRNONAA 87.98 07/30/2009 0815   GFRAA 104 07/11/2007 1017    Lipid Panel     Component Value Date/Time   CHOL 190 01/10/2012 0841   TRIG 124.0 01/10/2012 0841   HDL 39.50 01/10/2012 0841   CHOLHDL 5 01/10/2012 0841   VLDL 24.8 01/10/2012 0841   LDLCALC 126 (H) 01/10/2012 0841    CBC    Component Value Date/Time   WBC 8.0 01/10/2012 0841   RBC 5.49 01/10/2012 0841   HGB 16.4 01/10/2012 0841   HCT 49.2 01/10/2012 0841   PLT 273.0 01/10/2012 0841   MCV 89.7 01/10/2012 0841   MCHC 33.4 01/10/2012 0841   RDW 13.0 01/10/2012 0841   LYMPHSABS 2.5 01/10/2012 0841   MONOABS 0.5 01/10/2012 0841   EOSABS 0.1 01/10/2012 0841   BASOSABS 0.0 01/10/2012 0841    Hgb A1C No results found for: HGBA1C   Assessment and Plan:  Fatigue:  Advised him it is too late in the afternoon to have testosterone checked  He will make appt for CPE at 9:15 in the morning so that we can check testosterone at that time Nicki Reaper, NP

## 2017-11-15 NOTE — Assessment & Plan Note (Signed)
Controlled off meds  Will monitor 

## 2017-11-15 NOTE — Patient Instructions (Signed)

## 2017-11-21 ENCOUNTER — Encounter: Payer: 59 | Admitting: Internal Medicine

## 2017-12-01 ENCOUNTER — Ambulatory Visit (INDEPENDENT_AMBULATORY_CARE_PROVIDER_SITE_OTHER): Payer: 59 | Admitting: Internal Medicine

## 2017-12-01 ENCOUNTER — Encounter: Payer: Self-pay | Admitting: Internal Medicine

## 2017-12-01 VITALS — BP 128/84 | HR 71 | Temp 98.0°F | Ht 66.0 in | Wt 178.0 lb

## 2017-12-01 DIAGNOSIS — Z23 Encounter for immunization: Secondary | ICD-10-CM

## 2017-12-01 DIAGNOSIS — R5383 Other fatigue: Secondary | ICD-10-CM | POA: Diagnosis not present

## 2017-12-01 DIAGNOSIS — Z0000 Encounter for general adult medical examination without abnormal findings: Secondary | ICD-10-CM

## 2017-12-01 LAB — COMPREHENSIVE METABOLIC PANEL
ALK PHOS: 53 U/L (ref 39–117)
ALT: 34 U/L (ref 0–53)
AST: 21 U/L (ref 0–37)
Albumin: 4.6 g/dL (ref 3.5–5.2)
BUN: 26 mg/dL — AB (ref 6–23)
CHLORIDE: 104 meq/L (ref 96–112)
CO2: 26 mEq/L (ref 19–32)
Calcium: 9.5 mg/dL (ref 8.4–10.5)
Creatinine, Ser: 0.97 mg/dL (ref 0.40–1.50)
GFR: 85.13 mL/min (ref >60.00)
GLUCOSE: 97 mg/dL (ref 70–99)
Potassium: 4.3 mEq/L (ref 3.5–5.1)
SODIUM: 137 meq/L (ref 135–145)
TOTAL PROTEIN: 8 g/dL (ref 6.0–8.3)
Total Bilirubin: 1.1 mg/dL (ref 0.2–1.2)

## 2017-12-01 LAB — CBC
HEMATOCRIT: 49 % (ref 39.0–52.0)
HEMOGLOBIN: 16.8 g/dL (ref 13.0–17.0)
MCHC: 34.3 g/dL (ref 30.0–36.0)
MCV: 88 fl (ref 78.0–100.0)
Platelets: 260 10*3/uL (ref 150.0–400.0)
RBC: 5.57 Mil/uL (ref 4.22–5.81)
RDW: 13.2 % (ref 11.5–15.5)
WBC: 6 10*3/uL (ref 4.0–10.5)

## 2017-12-01 LAB — LIPID PANEL
CHOLESTEROL: 176 mg/dL (ref 0–200)
HDL: 42.6 mg/dL (ref >39.00)
LDL CALC: 116 mg/dL — AB (ref 0–99)
NONHDL: 133.4
Total CHOL/HDL Ratio: 4
Triglycerides: 87 mg/dL (ref 0.0–149.0)
VLDL: 17.4 mg/dL (ref 0.0–40.0)

## 2017-12-01 LAB — HEMOGLOBIN A1C: Hgb A1c MFr Bld: 5.7 % (ref 4.6–6.5)

## 2017-12-01 LAB — TESTOSTERONE: Testosterone: 278.08 ng/dL — ABNORMAL LOW (ref 300.00–890.00)

## 2017-12-01 NOTE — Patient Instructions (Signed)

## 2017-12-01 NOTE — Progress Notes (Signed)
Subjective:    Patient ID: Jeremy Johns, male    DOB: [birth year], 57 y.o.   MRN: [id number]  HPI  Pt presents to the clinic today for her annual exam.  Flu: never Tetanus: > 10 years ago PSA Screening: never Colon Screening: never Vision Screening: a needed Dentist: biannually  Diet: He does eat meat. He consumes some fruits and veggies. He tries to avoid fried foods. He drinks soda, tea and water. Exercise: None  Review of Systems      Past Medical History:  Diagnosis Date  . Allergic rhinitis   . HTN (hypertension)   . LBP (low back pain)   . Osteoarthritis     No current outpatient medications on file.   No current facility-administered medications for this visit.     No Known Allergies  Family History  Problem Relation Age of Onset  . Diabetes Mother   . Hypertension Mother   . Diabetes Father   . Hypertension Father   . Hypertension Unknown   . Diabetes Unknown     Social History   Socioeconomic History  . Marital status: Married    Spouse name: Not on file  . Number of children: Not on file  . Years of education: Not on file  . Highest education level: Not on file  Occupational History  . Not on file  Social Needs  . Financial resource strain: Not on file  . Food insecurity:    Worry: Not on file    Inability: Not on file  . Transportation needs:    Medical: Not on file    Non-medical: Not on file  Tobacco Use  . Smoking status: Never Smoker  . Smokeless tobacco: Never Used  Substance and Sexual Activity  . Alcohol use: No  . Drug use: No  . Sexual activity: Yes  Lifestyle  . Physical activity:    Days per week: Not on file    Minutes per session: Not on file  . Stress: Not on file  Relationships  . Social connections:    Talks on phone: Not on file    Gets together: Not on file    Attends religious service: Not on file    Active member of club or organization: Not on file    Attends meetings of clubs or organizations: Not  on file    Relationship status: Not on file  . Intimate partner violence:    Fear of current or ex partner: Not on file    Emotionally abused: Not on file    Physically abused: Not on file    Forced sexual activity: Not on file  Other Topics Concern  . Not on file  Social History Narrative  . Not on file     Constitutional: Pt reports fatigue. Denies fever, malaise, headache or abrupt weight changes.  HEENT: Denies eye pain, eye redness, ear pain, ringing in the ears, wax buildup, runny nose, nasal congestion, bloody nose, or sore throat. Respiratory: Denies difficulty breathing, shortness of breath, cough or sputum production.   Cardiovascular: Denies chest pain, chest tightness, palpitations or swelling in the hands or feet.  Gastrointestinal: Denies abdominal pain, bloating, constipation, diarrhea or blood in the stool.  GU: Denies urgency, frequency, pain with urination, burning sensation, blood in urine, odor or discharge. Musculoskeletal: Denies decrease in range of motion, difficulty with gait, muscle pain or joint pain and swelling.  Skin: Denies redness, rashes, lesions or ulcercations.  Neurological: Denies dizziness, difficulty with memory,  difficulty with speech or problems with balance and coordination.  Psych: Denies anxiety, depression, SI/HI.  No other specific complaints in a complete review of systems (except as listed in HPI above).  Objective:   Physical Exam  BP 128/84   Pulse 71   Temp 98 F (36.7 C) (Oral)   Ht 5\' 6"  (1.676 m)   Wt 178 lb (80.7 kg)   SpO2 98%   BMI 28.73 kg/m  Wt Readings from Last 3 Encounters:  12/01/17 178 lb (80.7 kg)  11/15/17 183 lb (83 kg)  01/13/12 176 lb (79.8 kg)    General: Appears her stated age, well developed, well nourished in NAD. Skin: Warm, dry and intact.  HEENT: Head: normal shape and size; Eyes: sclera white, no icterus, conjunctiva pink, PERRLA and EOMs intact; Ears: Tm's gray and intact, normal light reflex;  Throat/Mouth: Teeth present, mucosa pink and moist, no exudate, lesions or ulcerations noted.  Neck:  Neck supple, trachea midline. No masses, lumps or thyromegaly present.  Cardiovascular: Normal rate and rhythm. S1,S2 noted.  No murmur, rubs or gallops noted. No JVD or BLE edema. No carotid bruits noted. Pulmonary/Chest: Normal effort and positive vesicular breath sounds. No respiratory distress. No wheezes, rales or ronchi noted.  Abdomen: Soft and nontender. Normal bowel sounds. No distention or masses noted. Liver, spleen and kidneys non palpable. Musculoskeletal: Strength 5/5 BUE/BLE. No difficulty with gait.  Neurological: Alert and oriented. Cranial nerves II-XII grossly intact. Coordination normal.  Psychiatric: Mood and affect normal. Behavior is normal. Judgment and thought content normal.     BMET    Component Value Date/Time   NA 138 01/10/2012 0841   K 4.3 01/10/2012 0841   CL 102 01/10/2012 0841   CO2 28 01/10/2012 0841   GLUCOSE 87 01/10/2012 0841   BUN 18 01/10/2012 0841   CREATININE 1.0 01/10/2012 0841   CALCIUM 9.8 01/10/2012 0841   GFRNONAA 87.98 07/30/2009 0815   GFRAA 104 07/11/2007 1017    Lipid Panel     Component Value Date/Time   CHOL 190 01/10/2012 0841   TRIG 124.0 01/10/2012 0841   HDL 39.50 01/10/2012 0841   CHOLHDL 5 01/10/2012 0841   VLDL 24.8 01/10/2012 0841   LDLCALC 126 (H) 01/10/2012 0841    CBC    Component Value Date/Time   WBC 8.0 01/10/2012 0841   RBC 5.49 01/10/2012 0841   HGB 16.4 01/10/2012 0841   HCT 49.2 01/10/2012 0841   PLT 273.0 01/10/2012 0841   MCV 89.7 01/10/2012 0841   MCHC 33.4 01/10/2012 0841   RDW 13.0 01/10/2012 0841   LYMPHSABS 2.5 01/10/2012 0841   MONOABS 0.5 01/10/2012 0841   EOSABS 0.1 01/10/2012 0841   BASOSABS 0.0 01/10/2012 0841    Hgb A1C No results found for: HGBA1C          Assessment & Plan:   Preventative Health Maintenance:  He declines flu shot today Tdap today He declines PSA  and colon cancer screening Encouraged him to consume a balanced diet and exercise regimen Advised him to see an eye doctor and dentist annually Will check CBC, CMET, Lipid, A1C and testosterone today  RTC in 1 year, sooner if needed Nicki Reaper, NP

## 2017-12-01 NOTE — Addendum Note (Signed)
Addended by: Roena Malady on: 12/01/2017 12:32 PM   Modules accepted: Orders

## 2017-12-07 ENCOUNTER — Other Ambulatory Visit: Payer: Self-pay | Admitting: Internal Medicine

## 2017-12-07 DIAGNOSIS — R7989 Other specified abnormal findings of blood chemistry: Secondary | ICD-10-CM

## 2017-12-07 NOTE — Progress Notes (Signed)
amb ref °

## 2018-05-29 DIAGNOSIS — S43431A Superior glenoid labrum lesion of right shoulder, initial encounter: Secondary | ICD-10-CM | POA: Insufficient documentation

## 2018-05-29 DIAGNOSIS — M75101 Unspecified rotator cuff tear or rupture of right shoulder, not specified as traumatic: Secondary | ICD-10-CM | POA: Insufficient documentation

## 2019-01-05 ENCOUNTER — Telehealth: Payer: Self-pay | Admitting: Internal Medicine

## 2019-01-05 NOTE — Telephone Encounter (Signed)
Patient called today requesting lab work. Patient stated that he was wanting to see where all his levels were.  Do we need to go ahead and schedule his physical for next available December?

## 2019-01-05 NOTE — Telephone Encounter (Signed)
yes

## 2019-02-19 ENCOUNTER — Other Ambulatory Visit: Payer: Self-pay

## 2019-02-19 ENCOUNTER — Encounter: Payer: Self-pay | Admitting: Internal Medicine

## 2019-02-19 ENCOUNTER — Ambulatory Visit (INDEPENDENT_AMBULATORY_CARE_PROVIDER_SITE_OTHER): Payer: No Typology Code available for payment source | Admitting: Internal Medicine

## 2019-02-19 VITALS — BP 128/80 | HR 71 | Temp 97.4°F | Ht 66.0 in | Wt 176.0 lb

## 2019-02-19 DIAGNOSIS — Z0000 Encounter for general adult medical examination without abnormal findings: Secondary | ICD-10-CM

## 2019-02-19 DIAGNOSIS — M1991 Primary osteoarthritis, unspecified site: Secondary | ICD-10-CM

## 2019-02-19 DIAGNOSIS — I10 Essential (primary) hypertension: Secondary | ICD-10-CM

## 2019-02-19 DIAGNOSIS — Z125 Encounter for screening for malignant neoplasm of prostate: Secondary | ICD-10-CM | POA: Diagnosis not present

## 2019-02-19 NOTE — Assessment & Plan Note (Signed)
Encouraged regular stretching and physical activity Continue Ibuprofen prn

## 2019-02-19 NOTE — Assessment & Plan Note (Signed)
Controlled off meds  Will monitor 

## 2019-02-19 NOTE — Progress Notes (Signed)
Subjective:    Patient ID: Jeremy Johns, male    DOB: December 27, 1960, 58 y.o.   MRN: [id number]  HPI  Pt presents to the clinic today for his annual exam. He is also due to follow up chronic conditions.  HTN: His BP today is 128/80. He is not taking any antihypertensive medications at this time. There is no ECG on file.  Arthritis: Mainly in his right shoulder. He takes Ibuprofen as needed with good relief.  Flu: never Tetanus: 11/2017 PSA Screening: > 5 years ago Colon Screening: never Vision Screening: as needed Dentist: biannually  Diet: He does eat meat. He consumes more veggies than fruits. He occassionally eats fried foods. He drinks mostly water, soda and tea. Exericse: None  Review of Systems      Past Medical History:  Diagnosis Date  . Allergic rhinitis   . HTN (hypertension)   . LBP (low back pain)   . Osteoarthritis     No current outpatient medications on file.   No current facility-administered medications for this visit.    No Known Allergies  Family History  Problem Relation Age of Onset  . Diabetes Mother   . Hypertension Mother   . Diabetes Father   . Hypertension Father   . Hypertension Unknown   . Diabetes Unknown     Social History   Socioeconomic History  . Marital status: Married    Spouse name: Not on file  . Number of children: Not on file  . Years of education: Not on file  . Highest education level: Not on file  Occupational History  . Not on file  Tobacco Use  . Smoking status: Never Smoker  . Smokeless tobacco: Never Used  Substance and Sexual Activity  . Alcohol use: No  . Drug use: No  . Sexual activity: Yes  Other Topics Concern  . Not on file  Social History Narrative  . Not on file   Social Determinants of Health   Financial Resource Strain:   . Difficulty of Paying Living Expenses: Not on file  Food Insecurity:   . Worried About Charity fundraiser in the Last Year: Not on file  . Ran Out of Food in the  Last Year: Not on file  Transportation Needs:   . Lack of Transportation (Medical): Not on file  . Lack of Transportation (Non-Medical): Not on file  Physical Activity:   . Days of Exercise per Week: Not on file  . Minutes of Exercise per Session: Not on file  Stress:   . Feeling of Stress : Not on file  Social Connections:   . Frequency of Communication with Friends and Family: Not on file  . Frequency of Social Gatherings with Friends and Family: Not on file  . Attends Religious Services: Not on file  . Active Member of Clubs or Organizations: Not on file  . Attends Archivist Meetings: Not on file  . Marital Status: Not on file  Intimate Partner Violence:   . Fear of Current or Ex-Partner: Not on file  . Emotionally Abused: Not on file  . Physically Abused: Not on file  . Sexually Abused: Not on file     Constitutional: Denies fever, malaise, fatigue, headache or abrupt weight changes.  HEENT: Denies eye pain, eye redness, ear pain, ringing in the ears, wax buildup, runny nose, nasal congestion, bloody nose, or sore throat. Respiratory: Denies difficulty breathing, shortness of breath, cough or sputum production.   Cardiovascular:  Denies chest pain, chest tightness, palpitations or swelling in the hands or feet.  Gastrointestinal: Denies abdominal pain, bloating, constipation, diarrhea or blood in the stool.  GU: Denies urgency, frequency, pain with urination, burning sensation, blood in urine, odor or discharge. Musculoskeletal: Pt reports intermittent right shoulder pain. Denies decrease in range of motion, difficulty with gait, muscle pain or joint swelling.  Skin: Denies redness, rashes, lesions or ulcercations.  Neurological: Denies dizziness, difficulty with memory, difficulty with speech or problems with balance and coordination.  Psych: Denies anxiety, depression, SI/HI.  No other specific complaints in a complete review of systems (except as listed in HPI  above).  Objective:   Physical Exam BP 128/80   Pulse 71   Temp (!) 97.4 F (36.3 C) (Temporal)   Ht 5\' 6"  (1.676 m)   Wt 176 lb (79.8 kg)   SpO2 99%   BMI 28.41 kg/m   Wt Readings from Last 3 Encounters:  12/01/17 178 lb (80.7 kg)  11/15/17 183 lb (83 kg)  01/13/12 176 lb (79.8 kg)    General: Appears his stated age, well developed, well nourished in NAD. Skin: Warm, dry and intact. No rashesnoted. HEENT: Head: normal shape and size; Eyes: sclera white, no icterus, conjunctiva pink, PERRLA and EOMs intact;  Neck:  Neck supple, trachea midline. No masses, lumps or thyromegaly present.  Cardiovascular: Normal rate and rhythm. S1,S2 noted.  No murmur, rubs or gallops noted. No JVD or BLE edema. No carotid bruits noted. Pulmonary/Chest: Normal effort and positive vesicular breath sounds. No respiratory distress. No wheezes, rales or ronchi noted.  Abdomen: Soft and nontender. Normal bowel sounds. No distention or masses noted. Liver, spleen and kidneys non palpable. Musculoskeletal: Strength 5/5 BUE/BLE. No difficulty with gait.  Neurological: Alert and oriented. Cranial nerves II-XII grossly intact. Coordination normal.  Psychiatric: Mood and affect normal. Behavior is normal. Judgment and thought content normal.     BMET    Component Value Date/Time   NA 137 12/01/2017 0835   K 4.3 12/01/2017 0835   CL 104 12/01/2017 0835   CO2 26 12/01/2017 0835   GLUCOSE 97 12/01/2017 0835   BUN 26 (H) 12/01/2017 0835   CREATININE 0.97 12/01/2017 0835   CALCIUM 9.5 12/01/2017 0835   GFRNONAA 87.98 07/30/2009 0815   GFRAA 104 07/11/2007 1017    Lipid Panel     Component Value Date/Time   CHOL 176 12/01/2017 0835   TRIG 87.0 12/01/2017 0835   HDL 42.60 12/01/2017 0835   CHOLHDL 4 12/01/2017 0835   VLDL 17.4 12/01/2017 0835   LDLCALC 116 (H) 12/01/2017 0835    CBC    Component Value Date/Time   WBC 6.0 12/01/2017 0835   RBC 5.57 12/01/2017 0835   HGB 16.8 12/01/2017 0835     HCT 49.0 12/01/2017 0835   PLT 260.0 12/01/2017 0835   MCV 88.0 12/01/2017 0835   MCHC 34.3 12/01/2017 0835   RDW 13.2 12/01/2017 0835   LYMPHSABS 2.5 01/10/2012 0841   MONOABS 0.5 01/10/2012 0841   EOSABS 0.1 01/10/2012 0841   BASOSABS 0.0 01/10/2012 0841    Hgb A1C Lab Results  Component Value Date   HGBA1C 5.7 12/01/2017            Assessment & Plan:   Preventative Health Maintenance:  He declines flu shot today Tetanus UTD He declines referral to GI for colonoscopy, Cologuard or IFOB at this time Encouraged him to consume a balanced diet and exercise regimen Advised him to  see an eye doctor and dentist annually Will check CBC, CMET, Lipid, and PSA today  RTC in 1 year, sooner if needed  Nicki Reaperegina Jesse Hirst, NP This visit occurred during the SARS-CoV-2 public health emergency.  Safety protocols were in place, including screening questions prior to the visit, additional usage of staff PPE, and extensive cleaning of exam room while observing appropriate contact time as indicated for disinfecting solutions.

## 2019-02-19 NOTE — Patient Instructions (Signed)

## 2019-02-20 LAB — CBC
HCT: 47.5 % (ref 39.0–52.0)
Hemoglobin: 16.1 g/dL (ref 13.0–17.0)
MCHC: 33.8 g/dL (ref 30.0–36.0)
MCV: 88.9 fl (ref 78.0–100.0)
Platelets: 277 10*3/uL (ref 150.0–400.0)
RBC: 5.34 Mil/uL (ref 4.22–5.81)
RDW: 13.1 % (ref 11.5–15.5)
WBC: 9.7 10*3/uL (ref 4.0–10.5)

## 2019-02-20 LAB — COMPREHENSIVE METABOLIC PANEL
ALT: 23 U/L (ref 0–53)
AST: 17 U/L (ref 0–37)
Albumin: 4.6 g/dL (ref 3.5–5.2)
Alkaline Phosphatase: 56 U/L (ref 39–117)
BUN: 18 mg/dL (ref 6–23)
CO2: 27 mEq/L (ref 19–32)
Calcium: 9.6 mg/dL (ref 8.4–10.5)
Chloride: 103 mEq/L (ref 96–112)
Creatinine, Ser: 0.95 mg/dL (ref 0.40–1.50)
GFR: 81.69 mL/min (ref >60.00)
Glucose, Bld: 89 mg/dL (ref 70–99)
Potassium: 4.1 mEq/L (ref 3.5–5.1)
Sodium: 138 mEq/L (ref 135–145)
Total Bilirubin: 0.4 mg/dL (ref 0.2–1.2)
Total Protein: 7.3 g/dL (ref 6.0–8.3)

## 2019-02-20 LAB — LIPID PANEL
Cholesterol: 174 mg/dL (ref 0–200)
HDL: 39 mg/dL — ABNORMAL LOW (ref >39.00)
LDL Cholesterol: 104 mg/dL — ABNORMAL HIGH (ref 0–99)
NonHDL: 134.51
Total CHOL/HDL Ratio: 4
Triglycerides: 153 mg/dL — ABNORMAL HIGH (ref 0.0–149.0)
VLDL: 30.6 mg/dL (ref 0.0–40.0)

## 2019-02-20 LAB — PSA: PSA: 3.13 ng/mL (ref 0.10–4.00)

## 2019-05-15 ENCOUNTER — Encounter: Payer: Self-pay | Admitting: Internal Medicine

## 2019-05-15 ENCOUNTER — Ambulatory Visit (INDEPENDENT_AMBULATORY_CARE_PROVIDER_SITE_OTHER): Payer: No Typology Code available for payment source | Admitting: Internal Medicine

## 2019-05-15 ENCOUNTER — Other Ambulatory Visit: Payer: Self-pay

## 2019-05-15 DIAGNOSIS — J329 Chronic sinusitis, unspecified: Secondary | ICD-10-CM | POA: Diagnosis not present

## 2019-05-15 DIAGNOSIS — B9789 Other viral agents as the cause of diseases classified elsewhere: Secondary | ICD-10-CM | POA: Diagnosis not present

## 2019-05-15 MED ORDER — PREDNISONE 10 MG PO TABS: ORAL_TABLET | ORAL | 0 refills | Status: DC

## 2019-05-15 NOTE — Progress Notes (Signed)
Virtual Visit via Video Note  I connected with Jeremy Johns on 05/15/19 at  9:15 AM EST by a video enabled telemedicine application and verified that I am speaking with the correct person using two identifiers.  Location: Patient: Work Provider: Office   I discussed the limitations of evaluation and management by telemedicine and the availability of in person appointments. The patient expressed understanding and agreed to proceed.  History of Present Illness:  Pt reports facial pressure, ear fullness and cough. This started 1-2 weeks ago. He denies headache, facial pain, dizziness or visual changes. He denies ear pain or loss of hearing. The cough is dry and non productive. He denies runny nose, nasal congestion, sore throat, loss of taste/smell or SOB. He denies fever, chills or body aches. He did a virtual visit through his insurance company 1 week ago. He was diagnosed with allergies and prescribed Flonase and Sudafed which he took with minimal relief. He has not had sick contacts or exposure to Covid that he is aware of.   Past Medical History:  Diagnosis Date  . Allergic rhinitis   . HTN (hypertension)   . LBP (low back pain)   . Osteoarthritis     Current Outpatient Medications  Medication Sig Dispense Refill  . Cholecalciferol (VITAMIN D3) 50 MCG (2000 UT) TABS Take by mouth.    . Multiple Vitamin (MULTIVITAMIN) tablet Take 1 tablet by mouth daily.     No current facility-administered medications for this visit.    No Known Allergies  Family History  Problem Relation Age of Onset  . Diabetes Mother   . Hypertension Mother   . Diabetes Father   . Hypertension Father   . Hypertension Unknown   . Diabetes Unknown       Constitutional: Denies fever, malaise, fatigue, headache or abrupt weight changes.  HEENT: Pt reports facial pressure, ear fullness. Denies eye pain, eye redness, ear pain, ringing in the ears, wax buildup, runny nose, nasal congestion, bloody nose,  or sore throat. Respiratory: Pt reports cough. Denies difficulty breathing, shortness of breath, or sputum production.   Cardiovascular: Denies chest pain, chest tightness, palpitations or swelling in the hands or feet.   No other specific complaints in a complete review of systems (except as listed in HPI above).    Observations/Objective:   Wt Readings from Last 3 Encounters:  02/19/19 176 lb (79.8 kg)  12/01/17 178 lb (80.7 kg)  11/15/17 183 lb (83 kg)    General: Appears his stated age, well developed, well nourished in NAD. HEENT: Head: normal shape and size; Nose: no congestion noted ; Throat/Mouth: no hoarseness noted Pulmonary/Chest: Normal effort. No cough noted. No respiratory distress.  Neurological: Alert and oriented.   BMET    Component Value Date/Time   NA 138 02/19/2019 1535   K 4.1 02/19/2019 1535   CL 103 02/19/2019 1535   CO2 27 02/19/2019 1535   GLUCOSE 89 02/19/2019 1535   BUN 18 02/19/2019 1535   CREATININE 0.95 02/19/2019 1535   CALCIUM 9.6 02/19/2019 1535   GFRNONAA 87.98 07/30/2009 0815   GFRAA 104 07/11/2007 1017    Lipid Panel     Component Value Date/Time   CHOL 174 02/19/2019 1535   TRIG 153.0 (H) 02/19/2019 1535   HDL 39.00 (L) 02/19/2019 1535   CHOLHDL 4 02/19/2019 1535   VLDL 30.6 02/19/2019 1535   LDLCALC 104 (H) 02/19/2019 1535    CBC    Component Value Date/Time   WBC 9.7  02/19/2019 1535   RBC 5.34 02/19/2019 1535   HGB 16.1 02/19/2019 1535   HCT 47.5 02/19/2019 1535   PLT 277.0 02/19/2019 1535   MCV 88.9 02/19/2019 1535   MCHC 33.8 02/19/2019 1535   RDW 13.1 02/19/2019 1535   LYMPHSABS 2.5 01/10/2012 0841   MONOABS 0.5 01/10/2012 0841   EOSABS 0.1 01/10/2012 0841   BASOSABS 0.0 01/10/2012 0841    Hgb A1C Lab Results  Component Value Date   HGBA1C 5.7 12/01/2017       Assessment and Plan:  Viral Sinusitis:  Stop Sudafed and Flonase RX for Pred Taper x 6 days If no improvement by Friday, consider RX for  Augmentin 875-125 mg 1 tab BID x 10 days  Return precautions discussed Follow Up Instructions:    I discussed the assessment and treatment plan with the patient. The patient was provided an opportunity to ask questions and all were answered. The patient agreed with the plan and demonstrated an understanding of the instructions.   The patient was advised to call back or seek an in-person evaluation if the symptoms worsen or if the condition fails to improve as anticipated.  Nicki Reaper, NP

## 2019-05-15 NOTE — Patient Instructions (Signed)

## 2019-08-13 DIAGNOSIS — H16223 Keratoconjunctivitis sicca, not specified as Sjogren's, bilateral: Secondary | ICD-10-CM | POA: Insufficient documentation

## 2020-02-21 ENCOUNTER — Encounter: Payer: No Typology Code available for payment source | Admitting: Internal Medicine

## 2020-03-27 ENCOUNTER — Encounter: Payer: Self-pay | Admitting: Internal Medicine

## 2020-03-27 ENCOUNTER — Telehealth (INDEPENDENT_AMBULATORY_CARE_PROVIDER_SITE_OTHER): Payer: No Typology Code available for payment source | Admitting: Internal Medicine

## 2020-03-27 ENCOUNTER — Other Ambulatory Visit: Payer: Self-pay

## 2020-03-27 DIAGNOSIS — Z20828 Contact with and (suspected) exposure to other viral communicable diseases: Secondary | ICD-10-CM | POA: Diagnosis not present

## 2020-03-27 DIAGNOSIS — R52 Pain, unspecified: Secondary | ICD-10-CM

## 2020-03-27 DIAGNOSIS — R519 Headache, unspecified: Secondary | ICD-10-CM

## 2020-03-27 DIAGNOSIS — R5383 Other fatigue: Secondary | ICD-10-CM

## 2020-03-27 DIAGNOSIS — R0981 Nasal congestion: Secondary | ICD-10-CM

## 2020-03-27 DIAGNOSIS — J029 Acute pharyngitis, unspecified: Secondary | ICD-10-CM

## 2020-03-27 DIAGNOSIS — R509 Fever, unspecified: Secondary | ICD-10-CM

## 2020-03-27 MED ORDER — PREDNISONE 10 MG PO TABS: ORAL_TABLET | ORAL | 0 refills | Status: DC

## 2020-03-27 NOTE — Progress Notes (Signed)
Virtual Visit via Video Note  I connected with [name] on 03/27/20 at  9:45 AM EST by a video enabled telemedicine application and verified that I am speaking with the correct person using two identifiers.  Location: Patient: Home Provider: Office  Person's participating in this video call: Nicki Reaper, NP-C and Sydnee Cabal.   I discussed the limitations of evaluation and management by telemedicine and the availability of in person appointments. The patient expressed understanding and agreed to proceed.  History of Present Illness:   Patient reports left-sided facial pain and pressure, nasal congestion, sore throat, fever and body aches.  He reports this started 5 days ago.  He is not blowing any mucus out of his nose.  He is having some difficulty swallowing.  He reports the fever and body aches have improved over the last 2 days.  He denies headache, runny nose, ear pain, loss of taste/smell, cough, chest pain or shortness of breath.  He has tried ibuprofen and sinus medication OTC with some relief of symptoms.  He has had known flu exposure but denies COVID exposure.   Past Medical History:  Diagnosis Date  . Allergic rhinitis   . HTN (hypertension)   . LBP (low back pain)   . Osteoarthritis     Current Outpatient Medications  Medication Sig Dispense Refill  . Cholecalciferol (VITAMIN D3) 50 MCG (2000 UT) TABS Take by mouth.    . Multiple Vitamin (MULTIVITAMIN) tablet Take 1 tablet by mouth daily.     No current facility-administered medications for this visit.    No Known Allergies  Family History  Problem Relation Age of Onset  . Diabetes Mother   . Hypertension Mother   . Diabetes Father   . Hypertension Father   . Hypertension Unknown   . Diabetes Unknown     Social History   Socioeconomic History  . Marital status: Married    Spouse name: Not on file  . Number of children: Not on file  . Years of education: Not on file  . Highest education level:  Not on file  Occupational History  . Not on file  Tobacco Use  . Smoking status: Never Smoker  . Smokeless tobacco: Never Used  Substance and Sexual Activity  . Alcohol use: No  . Drug use: No  . Sexual activity: Yes  Other Topics Concern  . Not on file  Social History Narrative  . Not on file   Social Determinants of Health   Financial Resource Strain: Not on file  Food Insecurity: Not on file  Transportation Needs: Not on file  Physical Activity: Not on file  Stress: Not on file  Social Connections: Not on file  Intimate Partner Violence: Not on file     Constitutional: Pt reports fatigue, fever and body aches. Denies headache or abrupt weight changes.  HEENT: Pt reports nasal congestion, sore throat. Denies eye pain, eye redness, ear pain, ringing in the ears, wax buildup, runny nose, bloody nose. Respiratory: Denies difficulty breathing, shortness of breath, cough or sputum production.   Cardiovascular: Denies chest pain, chest tightness, palpitations or swelling in the hands or feet.  Gastrointestinal: Denies abdominal pain, bloating, constipation, diarrhea or blood in the stool.    No other specific complaints in a complete review of systems (except as listed in HPI above).  Observations/Objective:  There were no vitals taken for this visit. Wt Readings from Last 3 Encounters:  02/19/19 176 lb (79.8 kg)  12/01/17 178 lb (  80.7 kg)  11/15/17 183 lb (83 kg)    General: Appears his stated age, in NAD. HEENT:  Nose: no congestion noted ; Throat/Mouth: hoarseness noted Pulmonary/Chest: Normal effort. No respiratory distress.  Neurological: Alert and oriented.   BMET    Component Value Date/Time   NA 138 02/19/2019 1535   K 4.1 02/19/2019 1535   CL 103 02/19/2019 1535   CO2 27 02/19/2019 1535   GLUCOSE 89 02/19/2019 1535   BUN 18 02/19/2019 1535   CREATININE 0.95 02/19/2019 1535   CALCIUM 9.6 02/19/2019 1535   GFRNONAA 87.98 07/30/2009 0815   GFRAA 104  07/11/2007 1017    Lipid Panel     Component Value Date/Time   CHOL 174 02/19/2019 1535   TRIG 153.0 (H) 02/19/2019 1535   HDL 39.00 (L) 02/19/2019 1535   CHOLHDL 4 02/19/2019 1535   VLDL 30.6 02/19/2019 1535   LDLCALC 104 (H) 02/19/2019 1535    CBC    Component Value Date/Time   WBC 9.7 02/19/2019 1535   RBC 5.34 02/19/2019 1535   HGB 16.1 02/19/2019 1535   HCT 47.5 02/19/2019 1535   PLT 277.0 02/19/2019 1535   MCV 88.9 02/19/2019 1535   MCHC 33.8 02/19/2019 1535   RDW 13.1 02/19/2019 1535   LYMPHSABS 2.5 01/10/2012 0841   MONOABS 0.5 01/10/2012 0841   EOSABS 0.1 01/10/2012 0841   BASOSABS 0.0 01/10/2012 0841    Hgb A1C Lab Results  Component Value Date   HGBA1C 5.7 12/01/2017       Assessment and Plan:  Fatigue, Fever, Body Aches, Left Sided Facial Pain and Pressure, Nasal Congestion, Sore Throat, Exposure to Flu:  He declines flu and covid testing at this time RX for Pred Taper x 6 days for symptom management No indication for abx at this time Encouraged rest and fluids Can take Tylenol as needed for fever and body aches  Return precautions discussed Follow Up Instructions:    I discussed the assessment and treatment plan with the patient. The patient was provided an opportunity to ask questions and all were answered. The patient agreed with the plan and demonstrated an understanding of the instructions.   The patient was advised to call back or seek an in-person evaluation if the symptoms worsen or if the condition fails to improve as anticipated.   Nicki Reaper, NP

## 2020-03-27 NOTE — Patient Instructions (Signed)

## 2020-03-31 ENCOUNTER — Encounter: Payer: No Typology Code available for payment source | Admitting: Internal Medicine

## 2020-04-03 ENCOUNTER — Telehealth: Payer: Self-pay | Admitting: Internal Medicine

## 2020-04-03 MED ORDER — AZITHROMYCIN 250 MG PO TABS: ORAL_TABLET | ORAL | 0 refills | Status: DC

## 2020-04-03 NOTE — Telephone Encounter (Signed)
Azithromycin sent to [facility] 

## 2020-04-03 NOTE — Telephone Encounter (Signed)
Patient states he was instructed to call the office back if the medicine isn't working. Patient states he's feeling better, but his head is still congested and "it just won't go away." Patient uses the [facility] in [place]. Please advise.

## 2020-04-03 NOTE — Addendum Note (Signed)
Addended by: Lorre Munroe on: 04/03/2020 01:38 PM   Modules accepted: Orders

## 2020-05-20 ENCOUNTER — Ambulatory Visit (INDEPENDENT_AMBULATORY_CARE_PROVIDER_SITE_OTHER): Payer: No Typology Code available for payment source | Admitting: Internal Medicine

## 2020-05-20 ENCOUNTER — Encounter: Payer: Self-pay | Admitting: Internal Medicine

## 2020-05-20 ENCOUNTER — Other Ambulatory Visit: Payer: Self-pay

## 2020-05-20 VITALS — BP 136/96 | HR 72 | Temp 97.8°F | Ht 66.0 in | Wt 174.0 lb

## 2020-05-20 DIAGNOSIS — Z114 Encounter for screening for human immunodeficiency virus [HIV]: Secondary | ICD-10-CM | POA: Diagnosis not present

## 2020-05-20 DIAGNOSIS — Z1159 Encounter for screening for other viral diseases: Secondary | ICD-10-CM

## 2020-05-20 DIAGNOSIS — R1032 Left lower quadrant pain: Secondary | ICD-10-CM | POA: Diagnosis not present

## 2020-05-20 DIAGNOSIS — Z0001 Encounter for general adult medical examination with abnormal findings: Secondary | ICD-10-CM | POA: Diagnosis not present

## 2020-05-20 DIAGNOSIS — R3911 Hesitancy of micturition: Secondary | ICD-10-CM

## 2020-05-20 DIAGNOSIS — Z125 Encounter for screening for malignant neoplasm of prostate: Secondary | ICD-10-CM | POA: Diagnosis not present

## 2020-05-20 DIAGNOSIS — I10 Essential (primary) hypertension: Secondary | ICD-10-CM | POA: Diagnosis not present

## 2020-05-20 DIAGNOSIS — M47816 Spondylosis without myelopathy or radiculopathy, lumbar region: Secondary | ICD-10-CM

## 2020-05-20 DIAGNOSIS — R109 Unspecified abdominal pain: Secondary | ICD-10-CM

## 2020-05-20 LAB — LIPID PANEL
Cholesterol: 162 mg/dL (ref 0–200)
HDL: 47.3 mg/dL (ref >39.00)
LDL Cholesterol: 99 mg/dL (ref 0–99)
NonHDL: 114.96
Total CHOL/HDL Ratio: 3
Triglycerides: 82 mg/dL (ref 0.0–149.0)
VLDL: 16.4 mg/dL (ref 0.0–40.0)

## 2020-05-20 LAB — POC URINALSYSI DIPSTICK (AUTOMATED)
Bilirubin, UA: NEGATIVE
Blood, UA: NEGATIVE
Glucose, UA: NEGATIVE
Ketones, UA: NEGATIVE
Leukocytes, UA: NEGATIVE
Nitrite, UA: NEGATIVE
Protein, UA: POSITIVE — AB
Spec Grav, UA: 1.015 (ref 1.010–1.025)
Urobilinogen, UA: 0.2 E.U./dL
pH, UA: 7.5 (ref 5.0–8.0)

## 2020-05-20 LAB — COMPREHENSIVE METABOLIC PANEL
ALT: 27 U/L (ref 0–53)
AST: 17 U/L (ref 0–37)
Albumin: 4.6 g/dL (ref 3.5–5.2)
Alkaline Phosphatase: 52 U/L (ref 39–117)
BUN: 20 mg/dL (ref 6–23)
CO2: 30 mEq/L (ref 19–32)
Calcium: 10 mg/dL (ref 8.4–10.5)
Chloride: 103 mEq/L (ref 96–112)
Creatinine, Ser: 0.88 mg/dL (ref 0.40–1.50)
GFR: 94.9 mL/min (ref >60.00)
Glucose, Bld: 91 mg/dL (ref 70–99)
Potassium: 4.2 mEq/L (ref 3.5–5.1)
Sodium: 139 mEq/L (ref 135–145)
Total Bilirubin: 0.9 mg/dL (ref 0.2–1.2)
Total Protein: 7.7 g/dL (ref 6.0–8.3)

## 2020-05-20 LAB — CBC
HCT: 50.4 % (ref 39.0–52.0)
Hemoglobin: 17 g/dL (ref 13.0–17.0)
MCHC: 33.7 g/dL (ref 30.0–36.0)
MCV: 89.4 fl (ref 78.0–100.0)
Platelets: 305 10*3/uL (ref 150.0–400.0)
RBC: 5.64 Mil/uL (ref 4.22–5.81)
RDW: 13.9 % (ref 11.5–15.5)
WBC: 6.7 10*3/uL (ref 4.0–10.5)

## 2020-05-20 LAB — HEMOGLOBIN A1C: Hgb A1c MFr Bld: 5.6 % (ref 4.6–6.5)

## 2020-05-20 LAB — PSA: PSA: 4.61 ng/mL — ABNORMAL HIGH (ref 0.10–4.00)

## 2020-05-20 NOTE — Assessment & Plan Note (Signed)
Continue Ibuprofen as needed Encouraged regular stretching

## 2020-05-20 NOTE — Assessment & Plan Note (Signed)
Recheck 136/96 Discussed if this is persistent, will need to start medication therapy Reinforced DASH diet and exercise for weight loss  Follow up in 3 months re: HTN

## 2020-05-20 NOTE — Progress Notes (Signed)
Subjective:    Patient ID: Jeremy Johns, male    DOB: September 12, 1960, 60 y.o.   MRN: [id number]  HPI  Patient presents the clinic today for his annual exam.  He is also due to follow-up chronic conditions.  HTN: His BP today is 130/94.  He is not taking any antihypertensive medications at this time.  There is no ECG on file.  OA: Mainly in his right shoulder.  He takes Ibuprofen as needed with good relief of symptoms.  He does not follow with orthopedics.  Flu: Never Tetanus: 11/2017 Covid: never PSA screening: 02/2019 Colon screening: never Vision screening: As needed Dentist: Biannually  Diet: He does eat lean meat.  He consumes more veggies and fruit.  He tries to avoid fried foods.  He drinks mostly water, soda and gatorade. Exercise: None   Review of Systems      Past Medical History:  Diagnosis Date  . Allergic rhinitis   . HTN (hypertension)   . LBP (low back pain)   . Osteoarthritis     Current Outpatient Medications  Medication Sig Dispense Refill  . azithromycin (ZITHROMAX) 250 MG tablet Take 2 tabs today, then 1 tab daily x 4 days 6 tablet 0  . Cholecalciferol (VITAMIN D3) 50 MCG (2000 UT) TABS Take by mouth.    . Multiple Vitamin (MULTIVITAMIN) tablet Take 1 tablet by mouth daily.    . predniSONE (DELTASONE) 10 MG tablet Take 3 tabs on days 1-2, take 2 tabs on days 3-4, take 1 tab on days 5-6 12 tablet 0   No current facility-administered medications for this visit.    No Known Allergies  Family History  Problem Relation Age of Onset  . Diabetes Mother   . Hypertension Mother   . Diabetes Father   . Hypertension Father   . Hypertension Unknown   . Diabetes Unknown     Social History   Socioeconomic History  . Marital status: Married    Spouse name: Not on file  . Number of children: Not on file  . Years of education: Not on file  . Highest education level: Not on file  Occupational History  . Not on file  Tobacco Use  . Smoking status:  Never Smoker  . Smokeless tobacco: Never Used  Substance and Sexual Activity  . Alcohol use: No  . Drug use: No  . Sexual activity: Yes  Other Topics Concern  . Not on file  Social History Narrative  . Not on file   Social Determinants of Health   Financial Resource Strain: Not on file  Food Insecurity: Not on file  Transportation Needs: Not on file  Physical Activity: Not on file  Stress: Not on file  Social Connections: Not on file  Intimate Partner Violence: Not on file     Constitutional: Denies fever, malaise, fatigue, headache or abrupt weight changes.  HEENT: Denies eye pain, eye redness, ear pain, ringing in the ears, wax buildup, runny nose, nasal congestion, bloody nose, or sore throat. Respiratory: Denies difficulty breathing, shortness of breath, cough or sputum production.   Cardiovascular: Denies chest pain, chest tightness, palpitations or swelling in the hands or feet.  Gastrointestinal: Pt reports left flank pain, LLQ pain. Denies abdominal pain, bloating, constipation, diarrhea or blood in the stool.  GU: Pt reports urinary hesitancy, left testicular pain. Denies urgency, frequency, pain with urination, burning sensation, blood in urine, odor or discharge. Musculoskeletal: Patient reports intermittent right shoulder pain.  Denies decrease in  range of motion, difficulty with gait, muscle pain or joint swelling.  Skin: Denies redness, rashes, lesions or ulcercations.  Neurological: Denies dizziness, difficulty with memory, difficulty with speech or problems with balance and coordination.  Psych: Denies anxiety, depression, SI/HI.  No other specific complaints in a complete review of systems (except as listed in HPI above).  Objective:   Physical Exam BP (!) 130/94   Pulse 72   Temp 97.8 F (36.6 C) (Temporal)   Ht _0  (1.676 m)   Wt 174 lb (78.9 kg)   SpO2 99%   BMI 28.08 kg/m   Wt Readings from Last 3 Encounters:  02/19/19 176 lb (79.8 kg)   12/01/17 178 lb (80.7 kg)  11/15/17 183 lb (83 kg)    General: Appears his stated age, well developed, well nourished in NAD. Skin: Warm, dry and intact. No rashes, lesions or ulcerations noted. HEENT: Head: normal shape and size; Eyes: sclera white, no icterus, conjunctiva pink, PERRLA and EOMs intact; Neck:  Neck supple, trachea midline. No masses, lumps or thyromegaly present.  Cardiovascular: Normal rate and rhythm. S1,S2 noted.  No murmur, rubs or gallops noted. No JVD or BLE edema. No carotid bruits noted. Pulmonary/Chest: Normal effort and positive vesicular breath sounds. No respiratory distress. No wheezes, rales or ronchi noted.  Abdomen: Soft and mildly tender in the LLQ. Normal bowel sounds. No distention or masses noted. Liver, spleen and kidneys non palpable. No CVA tenderness. Musculoskeletal: Strength 5/5 BUE/BLE. No difficulty with gait.  Neurological: Alert and oriented. Cranial nerves II-XII grossly intact. Coordination normal.  Psychiatric: Mood and affect normal. Behavior is normal. Judgment and thought content normal.    BMET    Component Value Date/Time   NA 138 02/19/2019 1535   K 4.1 02/19/2019 1535   CL 103 02/19/2019 1535   CO2 27 02/19/2019 1535   GLUCOSE 89 02/19/2019 1535   BUN 18 02/19/2019 1535   CREATININE 0.95 02/19/2019 1535   CALCIUM 9.6 02/19/2019 1535   GFRNONAA 87.98 07/30/2009 0815   GFRAA 104 07/11/2007 1017    Lipid Panel     Component Value Date/Time   CHOL 174 02/19/2019 1535   TRIG 153.0 (H) 02/19/2019 1535   HDL 39.00 (L) 02/19/2019 1535   CHOLHDL 4 02/19/2019 1535   VLDL 30.6 02/19/2019 1535   LDLCALC 104 (H) 02/19/2019 1535    CBC    Component Value Date/Time   WBC 9.7 02/19/2019 1535   RBC 5.34 02/19/2019 1535   HGB 16.1 02/19/2019 1535   HCT 47.5 02/19/2019 1535   PLT 277.0 02/19/2019 1535   MCV 88.9 02/19/2019 1535   MCHC 33.8 02/19/2019 1535   RDW 13.1 02/19/2019 1535   LYMPHSABS 2.5 01/10/2012 0841   MONOABS  0.5 01/10/2012 0841   EOSABS 0.1 01/10/2012 0841   BASOSABS 0.0 01/10/2012 0841    Hgb A1C Lab Results  Component Value Date   HGBA1C 5.7 12/01/2017            Assessment & Plan:   Preventative Health Maintenance:  He declines flu shot Tetanus UTD He declines COVID vaccine He declines colon cancer screening Encouraged him to consume a balanced diet and exercise regimen Advised him to see an eye doctor and dentist annually We will check CBC, c-Met, lipid, A1c, PSA, HIV and hep C today  Urinary Hesitancy, LLQ Pain, Left Flank Pain, Hx of Kidney Stones:  Urinalysis: no blood or leukocytes No need to send urine culture Push fluids Monitor symptoms  for now. If persist or worsen, please let me know.  RTC in 1 year, sooner if needed Webb Silversmith, NP This visit occurred during the SARS-CoV-2 public health emergency.  Safety protocols were in place, including screening questions prior to the visit, additional usage of staff PPE, and extensive cleaning of exam room while observing appropriate contact time as indicated for disinfecting solutions.

## 2020-05-20 NOTE — Patient Instructions (Signed)

## 2020-05-20 NOTE — Addendum Note (Signed)
Addended by: Roena Malady on: 05/20/2020 03:55 PM   Modules accepted: Orders

## 2020-05-21 LAB — URINE CULTURE
MICRO NUMBER:: 11649090
Result:: NO GROWTH
SPECIMEN QUALITY:: ADEQUATE

## 2020-05-21 LAB — HEPATITIS C ANTIBODY
Hepatitis C Ab: NONREACTIVE
SIGNAL TO CUT-OFF: 0.02 (ref <1.00)

## 2020-05-21 LAB — HIV ANTIBODY (ROUTINE TESTING W REFLEX): HIV 1&2 Ab, 4th Generation: NONREACTIVE

## 2020-09-18 ENCOUNTER — Encounter: Payer: Self-pay | Admitting: Internal Medicine

## 2020-09-18 ENCOUNTER — Other Ambulatory Visit: Payer: Self-pay

## 2020-09-18 ENCOUNTER — Ambulatory Visit: Payer: No Typology Code available for payment source | Admitting: Internal Medicine

## 2020-09-18 VITALS — BP 153/96 | HR 84 | Temp 98.0°F | Resp 17 | Ht 66.0 in | Wt 181.0 lb

## 2020-09-18 DIAGNOSIS — R0981 Nasal congestion: Secondary | ICD-10-CM

## 2020-09-18 DIAGNOSIS — Z6827 Body mass index (BMI) 27.0-27.9, adult: Secondary | ICD-10-CM | POA: Insufficient documentation

## 2020-09-18 DIAGNOSIS — Z6828 Body mass index (BMI) 28.0-28.9, adult: Secondary | ICD-10-CM | POA: Insufficient documentation

## 2020-09-18 DIAGNOSIS — Z6829 Body mass index (BMI) 29.0-29.9, adult: Secondary | ICD-10-CM | POA: Diagnosis not present

## 2020-09-18 DIAGNOSIS — I10 Essential (primary) hypertension: Secondary | ICD-10-CM | POA: Diagnosis not present

## 2020-09-18 DIAGNOSIS — E663 Overweight: Secondary | ICD-10-CM | POA: Diagnosis not present

## 2020-09-18 DIAGNOSIS — R7303 Prediabetes: Secondary | ICD-10-CM | POA: Insufficient documentation

## 2020-09-18 MED ORDER — LOSARTAN POTASSIUM 25 MG PO TABS: 25.0000 mg | ORAL_TABLET | Freq: Every day | ORAL | 0 refills | Status: DC

## 2020-09-18 NOTE — Progress Notes (Signed)
Subjective:    Patient ID: Jeremy Johns, male    DOB: 1960-06-04, 60 y.o.   MRN: [id number]  HPI  Patient presents to clinic today for follow-up of hypertension.  His BP today is 153/96.  He is not currently taking any oral antihypertensive medications at this time. There is no ECG on file. He has been taking Afrin daily for 3 weeks.   Review of Systems     Past Medical History:  Diagnosis Date   Allergic rhinitis    HTN (hypertension)    LBP (low back pain)    Osteoarthritis     Current Outpatient Medications  Medication Sig Dispense Refill   Cholecalciferol (VITAMIN D3) 50 MCG (2000 UT) TABS Take by mouth.     Multiple Vitamin (MULTIVITAMIN) tablet Take 1 tablet by mouth daily.     No current facility-administered medications for this visit.    No Known Allergies  Family History  Problem Relation Age of Onset   Diabetes Mother    Hypertension Mother    Diabetes Father    Hypertension Father    Hypertension Unknown    Diabetes Unknown     Social History   Socioeconomic History   Marital status: Married    Spouse name: Not on file   Number of children: Not on file   Years of education: Not on file   Highest education level: Not on file  Occupational History   Not on file  Tobacco Use   Smoking status: Never   Smokeless tobacco: Never  Substance and Sexual Activity   Alcohol use: No   Drug use: No   Sexual activity: Yes  Other Topics Concern   Not on file  Social History Narrative   Not on file   Social Determinants of Health   Financial Resource Strain: Not on file  Food Insecurity: Not on file  Transportation Needs: Not on file  Physical Activity: Not on file  Stress: Not on file  Social Connections: Not on file  Intimate Partner Violence: Not on file     Constitutional: Denies fever, malaise, fatigue, headache or abrupt weight changes.  HEENT: Pt reports nasal congestion. Denies eye pain, eye redness, ear pain, ringing in the ears, wax  buildup, runny nose, bloody nose, or sore throat. Respiratory: Denies difficulty breathing, shortness of breath, cough or sputum production.   Cardiovascular: Denies chest pain, chest tightness, palpitations or swelling in the hands or feet.  Neurological: Denies dizziness, difficulty with memory, difficulty with speech or problems with balance and coordination.    No other specific complaints in a complete review of systems (except as listed in HPI above).  Objective:   Physical Exam  BP (!) 153/96 (BP Location: Right Arm, Patient Position: Sitting, Cuff Size: Large)   Pulse 84   Temp 98 F (36.7 C) (Temporal)   Resp 17   Ht 5\' 6"  (1.676 m)   Wt 181 lb (82.1 kg)   SpO2 100%   BMI 29.21 kg/m   Wt Readings from Last 3 Encounters:  05/20/20 174 lb (78.9 kg)  02/19/19 176 lb (79.8 kg)  12/01/17 178 lb (80.7 kg)    General: Appears his stated age, overweight in NAD. Skin: Dry and intact. HEENT: Head: normal shape and size; Eyes: sclera white and EOMs intact;  Neck:  Neck supple, trachea midline. No masses, lumps or thyromegaly present.  Cardiovascular: Normal rate and rhythm. S1,S2 noted.  No murmur, rubs or gallops noted. No JVD or BLE  edema.  Pulmonary/Chest: Normal effort and positive vesicular breath sounds. No respiratory distress. No wheezes, rales or ronchi noted.  Neurological: Alert and oriented.  Psychiatric: Mood and affect normal. Behavior is normal. Judgment and thought content normal.     BMET    Component Value Date/Time   NA 139 05/20/2020 0839   K 4.2 05/20/2020 0839   CL 103 05/20/2020 0839   CO2 30 05/20/2020 0839   GLUCOSE 91 05/20/2020 0839   BUN 20 05/20/2020 0839   CREATININE 0.88 05/20/2020 0839   CALCIUM 10.0 05/20/2020 0839   GFRNONAA 87.98 07/30/2009 0815   GFRAA 104 07/11/2007 1017    Lipid Panel     Component Value Date/Time   CHOL 162 05/20/2020 0839   TRIG 82.0 05/20/2020 0839   HDL 47.30 05/20/2020 0839   CHOLHDL 3 05/20/2020  0839   VLDL 16.4 05/20/2020 0839   LDLCALC 99 05/20/2020 0839    CBC    Component Value Date/Time   WBC 6.7 05/20/2020 0839   RBC 5.64 05/20/2020 0839   HGB 17.0 05/20/2020 0839   HCT 50.4 05/20/2020 0839   PLT 305.0 05/20/2020 0839   MCV 89.4 05/20/2020 0839   MCHC 33.7 05/20/2020 0839   RDW 13.9 05/20/2020 0839   LYMPHSABS 2.5 01/10/2012 0841   MONOABS 0.5 01/10/2012 0841   EOSABS 0.1 01/10/2012 0841   BASOSABS 0.0 01/10/2012 0841    Hgb A1C Lab Results  Component Value Date   HGBA1C 5.6 05/20/2020           Nasal Congestion:  Stop Afrin Start Flonase OTC  RTC in 2 weeks for BP check  Nicki Reaper, NP This visit occurred during the SARS-CoV-2 public health emergency.  Safety protocols were in place, including screening questions prior to the visit, additional usage of staff PPE, and extensive cleaning of exam room while observing appropriate contact time as indicated for disinfecting solutions.

## 2020-09-18 NOTE — Patient Instructions (Signed)

## 2020-09-18 NOTE — Assessment & Plan Note (Signed)
Will start Losartan 25 mg daily Reinforced DASH diet and exercise for weight loss Will check CMET in 2 weeks when we repeat blood pressure

## 2020-09-18 NOTE — Assessment & Plan Note (Signed)
Encourage diet and exercise for weight loss 

## 2020-10-02 ENCOUNTER — Ambulatory Visit (INDEPENDENT_AMBULATORY_CARE_PROVIDER_SITE_OTHER): Payer: No Typology Code available for payment source | Admitting: Internal Medicine

## 2020-10-02 ENCOUNTER — Other Ambulatory Visit: Payer: Self-pay

## 2020-10-02 ENCOUNTER — Encounter: Payer: Self-pay | Admitting: Internal Medicine

## 2020-10-02 VITALS — BP 142/96 | HR 90 | Temp 97.5°F | Resp 17 | Ht 66.0 in | Wt 181.0 lb

## 2020-10-02 DIAGNOSIS — Z6829 Body mass index (BMI) 29.0-29.9, adult: Secondary | ICD-10-CM

## 2020-10-02 DIAGNOSIS — E663 Overweight: Secondary | ICD-10-CM

## 2020-10-02 DIAGNOSIS — R202 Paresthesia of skin: Secondary | ICD-10-CM

## 2020-10-02 DIAGNOSIS — I10 Essential (primary) hypertension: Secondary | ICD-10-CM | POA: Diagnosis not present

## 2020-10-02 NOTE — Assessment & Plan Note (Signed)
Encouraged diet and exercise for weight loss ?

## 2020-10-02 NOTE — Patient Instructions (Signed)

## 2020-10-02 NOTE — Assessment & Plan Note (Signed)
Still uncontrolled Increase Losartan to 50 mg daily Reinforced DASH diet and exercise for weight loss

## 2020-10-02 NOTE — Progress Notes (Signed)
Subjective:    Patient ID: Jeremy Johns, male    DOB: [birth year], 60 y.o.   MRN: [id number]  HPI  Patient presents the clinic today for follow-up of hypertension.  At his last visit he was started on Losartan.  He has been taking the medication as prescribed.  His BP today is 142/96.  There is no ECG on file.  He also reports tingling to his left lateral foot. He noticed this yesterday. He denies back pain (outside of a kidney stone that won't pass). He denies swelling, discoloration. He denies any injury to the area. He is not diabetic.  Review of Systems     Past Medical History:  Diagnosis Date   Allergic rhinitis    HTN (hypertension)    LBP (low back pain)    Osteoarthritis     Current Outpatient Medications  Medication Sig Dispense Refill   Cholecalciferol (VITAMIN D3) 50 MCG (2000 UT) TABS Take by mouth.     losartan (COZAAR) 25 MG tablet Take 1 tablet (25 mg total) by mouth daily. 30 tablet 0   Multiple Vitamin (MULTIVITAMIN) tablet Take 1 tablet by mouth daily.     oxymetazoline (AFRIN) 0.05 % nasal spray Place 2 sprays into both nostrils 2 (two) times daily.     tamsulosin (FLOMAX) 0.4 MG CAPS capsule Take 0.4 mg by mouth daily.     No current facility-administered medications for this visit.    No Known Allergies  Family History  Problem Relation Age of Onset   Diabetes Mother    Hypertension Mother    Diabetes Father    Hypertension Father    Hypertension Unknown    Diabetes Unknown     Social History   Socioeconomic History   Marital status: Married    Spouse name: Not on file   Number of children: Not on file   Years of education: Not on file   Highest education level: Not on file  Occupational History   Not on file  Tobacco Use   Smoking status: Never   Smokeless tobacco: Never  Substance and Sexual Activity   Alcohol use: No   Drug use: No   Sexual activity: Yes  Other Topics Concern   Not on file  Social History Narrative   Not on  file   Social Determinants of Health   Financial Resource Strain: Not on file  Food Insecurity: Not on file  Transportation Needs: Not on file  Physical Activity: Not on file  Stress: Not on file  Social Connections: Not on file  Intimate Partner Violence: Not on file     Constitutional: Denies fever, malaise, fatigue, headache or abrupt weight changes.  Respiratory: Denies difficulty breathing, shortness of breath, cough or sputum production.   Cardiovascular: Denies chest pain, chest tightness, palpitations or swelling in the hands or feet.  Musculoskeletal: Denies decrease in range of motion, difficulty with gait, muscle pain or joint pain and swelling.  Skin: Denies redness, rashes, lesions or ulcercations.  Neurological: Pt reports tingling to his left foot. Denies dizziness, difficulty with memory, difficulty  No other specific complaints in a complete review of systems (except as listed in HPI above).  Objective:   Physical Exam BP (!) 142/96 (BP Location: Right Arm, Patient Position: Sitting, Cuff Size: Normal)   Pulse 90   Temp (!) 97.5 F (36.4 C) (Temporal)   Resp 17   Ht 5\' 6"  (1.676 m)   Wt 181 lb (82.1 kg)   SpO2  98%   BMI 29.21 kg/m   Wt Readings from Last 3 Encounters:  09/18/20 181 lb (82.1 kg)  05/20/20 174 lb (78.9 kg)  02/19/19 176 lb (79.8 kg)    General: Appears his stated age, overweight, in NAD. Skin: Warm, dry and intact. No rashes, lesions or ulcerations noted. Cardiovascular: Normal rate. Pedal pulse 2+ on the left. Pulmonary/Chest: Normal effort. Musculoskeletal: Normal flexion, extension and rotation of the left ankle.  No signs of joint swelling. No difficulty with gait.  Neurological: Alert and oriented.    BMET    Component Value Date/Time   NA 139 05/20/2020 0839   K 4.2 05/20/2020 0839   CL 103 05/20/2020 0839   CO2 30 05/20/2020 0839   GLUCOSE 91 05/20/2020 0839   BUN 20 05/20/2020 0839   CREATININE 0.88 05/20/2020 0839    CALCIUM 10.0 05/20/2020 0839   GFRNONAA 87.98 07/30/2009 0815   GFRAA 104 07/11/2007 1017    Lipid Panel     Component Value Date/Time   CHOL 162 05/20/2020 0839   TRIG 82.0 05/20/2020 0839   HDL 47.30 05/20/2020 0839   CHOLHDL 3 05/20/2020 0839   VLDL 16.4 05/20/2020 0839   LDLCALC 99 05/20/2020 0839    CBC    Component Value Date/Time   WBC 6.7 05/20/2020 0839   RBC 5.64 05/20/2020 0839   HGB 17.0 05/20/2020 0839   HCT 50.4 05/20/2020 0839   PLT 305.0 05/20/2020 0839   MCV 89.4 05/20/2020 0839   MCHC 33.7 05/20/2020 0839   RDW 13.9 05/20/2020 0839   LYMPHSABS 2.5 01/10/2012 0841   MONOABS 0.5 01/10/2012 0841   EOSABS 0.1 01/10/2012 0841   BASOSABS 0.0 01/10/2012 0841    Hgb A1C Lab Results  Component Value Date   HGBA1C 5.6 05/20/2020            Assessment & Plan:   Tingling, Left Foot:  Likely not sciatica Circulation intact If persists, could check Vit D, B12 and TSH  Update me in 1 week with BP readings  Nicki Reaper, NP This visit occurred during the SARS-CoV-2 public health emergency.  Safety protocols were in place, including screening questions prior to the visit, additional usage of staff PPE, and extensive cleaning of exam room while observing appropriate contact time as indicated for disinfecting solutions.

## 2020-10-09 ENCOUNTER — Other Ambulatory Visit: Payer: Self-pay | Admitting: Internal Medicine

## 2020-10-09 ENCOUNTER — Telehealth: Payer: Self-pay

## 2020-10-09 NOTE — Telephone Encounter (Signed)
Better. Continue Losartan 50 mg daily

## 2020-10-09 NOTE — Telephone Encounter (Signed)
Copied from CRM [id number]. Topic: General - Other >> Oct 09, 2020  8:46 AM Gaetana Michaelis A wrote: Reason for CRM: Patient called to share that their BP was 138/88 taken at approx 4:30 PM 10/08/20

## 2020-10-09 NOTE — Telephone Encounter (Signed)
The pt was notified to continue on the losartan.

## 2020-10-10 MED ORDER — LOSARTAN POTASSIUM 50 MG PO TABS: 50.0000 mg | ORAL_TABLET | Freq: Every day | ORAL | 3 refills | Status: DC

## 2020-10-10 NOTE — Telephone Encounter (Signed)
Patient called in to inform her that he is out of of Losartan 50 mg and need an Rx called in please last Rx sent on 10/02/20 was 25 mg and he was taking 2 tabs so they are done in 15 days which is today. Please send Rx today. Ph# [phone]

## 2020-10-10 NOTE — Addendum Note (Signed)
Addended by: Lonna Cobb on: 10/10/2020 10:33 AM   Modules accepted: Orders

## 2021-02-11 ENCOUNTER — Telehealth: Payer: Self-pay | Admitting: Internal Medicine

## 2021-02-11 NOTE — Telephone Encounter (Signed)
Appt scheduled with Rene Kocher on Tuesday, Dec 13th at 3:20pm.

## 2021-02-11 NOTE — Telephone Encounter (Signed)
Referral Request - Has patient seen PCP for this complaint? yes *If NO, is insurance requiring patient see PCP for this issue before PCP can refer them? Referral for which specialty:urologist Preferred provider/office: N/A Reason for referral:mild pain by testicle and lower back

## 2021-02-17 ENCOUNTER — Encounter: Payer: Self-pay | Admitting: Internal Medicine

## 2021-02-17 ENCOUNTER — Other Ambulatory Visit: Payer: Self-pay

## 2021-02-17 ENCOUNTER — Ambulatory Visit
Admission: RE | Admit: 2021-02-17 | Discharge: 2021-02-17 | Disposition: A | Discharge status: Home / Self Care | Payer: No Typology Code available for payment source | Source: Ambulatory Visit | Attending: Internal Medicine | Admitting: Internal Medicine

## 2021-02-17 ENCOUNTER — Ambulatory Visit
Admission: RE | Admit: 2021-02-17 | Discharge: 2021-02-17 | Disposition: A | Discharge status: Home / Self Care | Payer: No Typology Code available for payment source | Attending: Internal Medicine | Admitting: Internal Medicine

## 2021-02-17 ENCOUNTER — Ambulatory Visit: Payer: No Typology Code available for payment source | Admitting: Internal Medicine

## 2021-02-17 VITALS — BP 134/91 | HR 82 | Temp 97.7°F | Resp 17 | Ht 66.0 in | Wt 183.0 lb

## 2021-02-17 DIAGNOSIS — N50812 Left testicular pain: Secondary | ICD-10-CM | POA: Diagnosis not present

## 2021-02-17 DIAGNOSIS — R8290 Unspecified abnormal findings in urine: Secondary | ICD-10-CM

## 2021-02-17 DIAGNOSIS — R109 Unspecified abdominal pain: Secondary | ICD-10-CM

## 2021-02-17 DIAGNOSIS — Z87442 Personal history of urinary calculi: Secondary | ICD-10-CM | POA: Diagnosis not present

## 2021-02-17 LAB — POCT URINALYSIS DIPSTICK
Bilirubin, UA: NEGATIVE
Glucose, UA: NEGATIVE
Ketones, UA: NEGATIVE
Nitrite, UA: NEGATIVE
Protein, UA: NEGATIVE
Spec Grav, UA: 1.02 (ref 1.010–1.025)
Urobilinogen, UA: 0.2 E.U./dL
pH, UA: 5 (ref 5.0–8.0)

## 2021-02-17 NOTE — Progress Notes (Signed)
Subjective:    Patient ID: Jeremy Johns, male    DOB: 03/16/1960, 60 y.o.   MRN: [id number]  HPI  Pt presents to the clinic today with c/o left flank pain. He reports this started 1 week ago. He describes the pain as sharp and shooting. The pain radiates into his left testicle. He denies abdominal pain, urgency, frequency, hesitancy, blood in his urine, penile discharge, lesion/rash or testicular pain or swelling. The pain is not worse with movement. His bowels are moving normally. He has a history of kidney stones. He has not taken anything OTC for this. He would like a referral to urology for further evaluation.  Review of Systems  Past Medical History:  Diagnosis Date   Allergic rhinitis    HTN (hypertension)    LBP (low back pain)    Osteoarthritis     Current Outpatient Medications  Medication Sig Dispense Refill   Cholecalciferol (VITAMIN D3) 50 MCG (2000 UT) TABS Take by mouth.     losartan (COZAAR) 25 MG tablet Take 2 tablets (50 mg total) by mouth daily. 30 tablet 0   losartan (COZAAR) 50 MG tablet Take 1 tablet (50 mg total) by mouth daily. 90 tablet 3   Multiple Vitamin (MULTIVITAMIN) tablet Take 1 tablet by mouth daily.     oxymetazoline (AFRIN) 0.05 % nasal spray Place 2 sprays into both nostrils 2 (two) times daily.     No current facility-administered medications for this visit.    No Known Allergies  Family History  Problem Relation Age of Onset   Diabetes Mother    Hypertension Mother    Diabetes Father    Hypertension Father    Hypertension Unknown    Diabetes Unknown     Social History   Socioeconomic History   Marital status: Married    Spouse name: Not on file   Number of children: Not on file   Years of education: Not on file   Highest education level: Not on file  Occupational History   Not on file  Tobacco Use   Smoking status: Never   Smokeless tobacco: Never  Substance and Sexual Activity   Alcohol use: No   Drug use: No   Sexual  activity: Yes  Other Topics Concern   Not on file  Social History Narrative   Not on file   Social Determinants of Health   Financial Resource Strain: Not on file  Food Insecurity: Not on file  Transportation Needs: Not on file  Physical Activity: Not on file  Stress: Not on file  Social Connections: Not on file  Intimate Partner Violence: Not on file     Constitutional: Denies fever, malaise, fatigue, headache or abrupt weight changes.  Respiratory: Denies difficulty breathing, shortness of breath, cough or sputum production.   Cardiovascular: Denies chest pain, chest tightness, palpitations or swelling in the hands or feet.  Gastrointestinal: Pt reports left flank pain. Denies abdominal pain, bloating, constipation, diarrhea or blood in the stool.  GU: Pt reports left testicular pain. Denies urgency, frequency, pain with urination, burning sensation, blood in urine, odor or discharge. Musculoskeletal: Denies decrease in range of motion, difficulty with gait, muscle pain or joint pain and swelling.  Skin: Denies redness, rashes, lesions or ulcercations.  Neurological: Denies numbness, tingling, weakness or problems with balance and coordination.    No other specific complaints in a complete review of systems (except as listed in HPI above).     Objective:   Physical Exam  BP (!) 134/91 (BP Location: Right Arm, Patient Position: Sitting, Cuff Size: Normal)    Pulse 82    Temp 97.7 F (36.5 C) (Temporal)    Resp 17    Ht 5\' 6"  (1.676 m)    Wt 183 lb (83 kg)    SpO2 99%    BMI 29.54 kg/m   Wt Readings from Last 3 Encounters:  10/02/20 181 lb (82.1 kg)  09/18/20 181 lb (82.1 kg)  05/20/20 174 lb (78.9 kg)    General: Appears his stated age, overweight,  in NAD. Skin: Warm, dry and intact. No rashes noted. Cardiovascular: Normal rate and rhythm. S1,S2 noted.  No murmur, rubs or gallops noted.  Pulmonary/Chest: Normal effort and positive vesicular breath sounds. No  respiratory distress. No wheezes, rales or ronchi noted.  Abdomen: Soft and nontender. Normal bowel sounds. No distention or masses noted. No CVA tenderness noted. Musculoskeletal: Normal flexion, extension, rotation and lateral bending of the spine. No bony tenderness noted over the spine. No difficulty with gait.  Neurological: Alert and oriented.    BMET    Component Value Date/Time   NA 139 05/20/2020 0839   K 4.2 05/20/2020 0839   CL 103 05/20/2020 0839   CO2 30 05/20/2020 0839   GLUCOSE 91 05/20/2020 0839   BUN 20 05/20/2020 0839   CREATININE 0.88 05/20/2020 0839   CALCIUM 10.0 05/20/2020 0839   GFRNONAA 87.98 07/30/2009 0815   GFRAA 104 07/11/2007 1017    Lipid Panel     Component Value Date/Time   CHOL 162 05/20/2020 0839   TRIG 82.0 05/20/2020 0839   HDL 47.30 05/20/2020 0839   CHOLHDL 3 05/20/2020 0839   VLDL 16.4 05/20/2020 0839   LDLCALC 99 05/20/2020 0839    CBC    Component Value Date/Time   WBC 6.7 05/20/2020 0839   RBC 5.64 05/20/2020 0839   HGB 17.0 05/20/2020 0839   HCT 50.4 05/20/2020 0839   PLT 305.0 05/20/2020 0839   MCV 89.4 05/20/2020 0839   MCHC 33.7 05/20/2020 0839   RDW 13.9 05/20/2020 0839   LYMPHSABS 2.5 01/10/2012 0841   MONOABS 0.5 01/10/2012 0841   EOSABS 0.1 01/10/2012 0841   BASOSABS 0.0 01/10/2012 0841    Hgb A1C Lab Results  Component Value Date   HGBA1C 5.6 05/20/2020           Assessment & Plan:   Left Flank Pain, Left Testicle Pain:  Urinalysis: small leuks with moderate calcium oxalate crystals Will send urine culture KUB today for evaluation of possible kidney stone 05/22/2020 scrotum ordered- you will be called to schedule this Referral to urology per pt request He declines RX for pain medication at this time Push fluids  Will follow up after imaging with further recommendation and treatment plan [place], NP This visit occurred during the SARS-CoV-2 public health emergency.  Safety protocols were in place,  including screening questions prior to the visit, additional usage of staff PPE, and extensive cleaning of exam room while observing appropriate contact time as indicated for disinfecting solutions.

## 2021-02-18 LAB — URINALYSIS, MICROSCOPIC ONLY
Bacteria, UA: NONE SEEN /HPF
Hyaline Cast: NONE SEEN /LPF
Squamous Epithelial / HPF: NONE SEEN /HPF (ref <=5)
WBC, UA: NONE SEEN /HPF (ref 0–5)

## 2021-02-18 NOTE — Patient Instructions (Signed)
Flank Pain, Adult ?Flank pain is pain in your side. The flank is the area on your side between your upper belly (abdomen) and your spine. The pain may occur over a short time (acute), or it may be long-term or come back often (chronic). It may be mild or very bad. Pain in this area can be caused by many different things. ?Follow these instructions at home: ? ?Drink enough fluid to keep your pee (urine) pale yellow. ?Rest as told by your doctor. ?Take over-the-counter and prescription medicines only as told by your doctor. ?Keep a journal to keep track of: ?What has caused your flank pain. ?What has made your flank pain feel better. ?Keep all follow-up visits. ?Contact a doctor if: ?Medicine does not help your pain. ?You have new symptoms. ?Your pain gets worse. ?Your symptoms last longer than 2-3 days. ?You have trouble peeing. ?You are peeing more often than normal. ?Get help right away if: ?You have trouble breathing. ?You are short of breath. ?Your belly hurts, or it is swollen or red. ?You feel like you may vomit (nauseous). ?You vomit. ?You feel faint, or you faint. ?You have blood in your pee. ?You have flank pain and a fever. ?These symptoms may be an emergency. Get help right away. Call your local emergency services (911 in the [place] ?Do not wait to see if the symptoms will go away. ?Do not drive yourself to the hospital. ?Summary ?Flank pain is pain in your side. The flank is the area of your side between your upper belly (abdomen) and your spine. ?Flank pain may occur over a short time (acute), or it may be long-term or come back often (chronic). It may be mild or very bad. ?Pain in this area can be caused by many different things. ?Contact your doctor if your symptoms get worse or last longer than 2-3 days. ?This information is not intended to replace advice given to you by your health care provider. Make sure you discuss any questions you have with your health care provider. ?Document Revised:  05/05/2020 Document Reviewed: 05/05/2020 ?Elsevier Patient Education ? 2022 Elsevier Inc[employer] ? ?

## 2021-02-19 LAB — URINE CULTURE
MICRO NUMBER:: 12754583
Result:: NO GROWTH
SPECIMEN QUALITY:: ADEQUATE

## 2021-02-23 MED ORDER — TRAMADOL HCL 50 MG PO TABS: 50.0000 mg | ORAL_TABLET | Freq: Three times a day (TID) | ORAL | 0 refills | Status: AC | PRN

## 2021-02-25 ENCOUNTER — Ambulatory Visit: Payer: No Typology Code available for payment source

## 2021-03-11 ENCOUNTER — Other Ambulatory Visit: Payer: Self-pay

## 2021-03-11 ENCOUNTER — Ambulatory Visit: Payer: No Typology Code available for payment source | Admitting: Urology

## 2021-03-11 ENCOUNTER — Encounter: Payer: Self-pay | Admitting: Urology

## 2021-03-11 VITALS — BP 167/112 | HR 89 | Ht 66.0 in | Wt 175.0 lb

## 2021-03-11 DIAGNOSIS — R109 Unspecified abdominal pain: Secondary | ICD-10-CM | POA: Diagnosis not present

## 2021-03-11 DIAGNOSIS — N200 Calculus of kidney: Secondary | ICD-10-CM | POA: Diagnosis not present

## 2021-03-11 LAB — MICROSCOPIC EXAMINATION
Bacteria, UA: NONE SEEN
RBC, Urine: NONE SEEN /hpf (ref 0–2)

## 2021-03-11 LAB — URINALYSIS, COMPLETE
Bilirubin, UA: NEGATIVE
Leukocytes,UA: NEGATIVE
Nitrite, UA: NEGATIVE
Protein,UA: NEGATIVE
RBC, UA: NEGATIVE
Specific Gravity, UA: 1.025 (ref 1.005–1.030)
Urobilinogen, Ur: 1 mg/dL (ref 0.2–1.0)
pH, UA: 6.5 (ref 5.0–7.5)

## 2021-03-11 MED ORDER — TAMSULOSIN HCL 0.4 MG PO CAPS: 0.4000 mg | ORAL_CAPSULE | Freq: Every day | ORAL | 0 refills | Status: DC

## 2021-03-11 NOTE — Progress Notes (Signed)
03/11/2021 3:08 PM   Jeremy Johns 1960/03/19 [id number]  Referring provider: Jearld Fenton, NP [address] [place],  [place] [address]  Chief Complaint  Patient presents with   Nephrolithiasis    HPI: Jeremy Johns is a 61 y.o. male who presents for evaluation of nephrolithiasis.  History of stone disease since 2018 Required an ED visit 02/2017 while traveling to [place].  CT showed small, bilateral renal calculi and a 5 mm left mid ureteral calculus which he subsequently passed Saw PCP 02/17/2021 with a 1 week history of left flank pain radiating to left hemiscrotum No bothersome LUTS, fever, chills or gross hematuria KUB was performed which showed bilateral upper pole calcifications consistent with nephrolithiasis   PMH: Past Medical History:  Diagnosis Date   Allergic rhinitis    HTN (hypertension)    LBP (low back pain)    Osteoarthritis     Surgical History: Past Surgical History:  Procedure Laterality Date   EYE SURGERY Bilateral 2012   R cataract    Home Medications:  Allergies as of 03/11/2021   No Known Allergies      Medication List        Accurate as of March 11, 2021  3:08 PM. If you have any questions, ask your nurse or doctor.          losartan 50 MG tablet Commonly known as: COZAAR Take 1 tablet (50 mg total) by mouth daily.   multivitamin tablet Take 1 tablet by mouth daily.   Vitamin D3 50 MCG (2000 UT) Tabs Take by mouth.        Allergies: No Known Allergies  Family History: Family History  Problem Relation Age of Onset   Diabetes Mother    Hypertension Mother    Diabetes Father    Hypertension Father    Hypertension Unknown    Diabetes Unknown     Social History:  reports that he has never smoked. He has never used smokeless tobacco. He reports that he does not drink alcohol and does not use drugs.   Physical Exam: BP (!) 167/112    Pulse 89    Ht 5\' 6"  (1.676 m)    Wt 175 lb (79.4 kg)    BMI 28.25 kg/m    Constitutional:  Alert and oriented, No acute distress. HEENT: [place] AT, moist mucus membranes.  Trachea midline, no masses. Cardiovascular: No clubbing, cyanosis, or edema. Respiratory: Normal respiratory effort, no increased work of breathing. Neurologic: Grossly intact, no focal deficits, moving all 4 extremities. Psychiatric: Normal mood and affect.  Laboratory Data:  Urinalysis 1+ glucose/microscopy negtive  Pertinent Imaging: KUB images 02/17/2021 were personally reviewed   DG Abd 1 View  Narrative CLINICAL DATA:  Left flank pain.  EXAM: ABDOMEN - 1 VIEW  COMPARISON:  May 29, 2020  FINDINGS: The bowel gas pattern is normal. A 5 mm soft tissue calcification is seen projecting over the right kidney. A similar appearing 8 mm soft tissue calcification is seen projecting over the left kidney. These areas are stable in size and position when compared to the prior study.  IMPRESSION: Stable bilateral renal calculi.   Electronically Signed By: Virgina Norfolk M.D. On: 02/18/2021 21:37    Assessment & Plan:    1.  Left flank pain Current pain typical of stone disease Calcifications seen on KUB are consistent with nonobstructing renal calculi Recommend stone protocol CT for evaluation of ureteral calculi.  Will notify with results Rx tamsulosin 0.4 mg  sent to pharmacy Strain urine  2.  Bilateral nephrolithiasis As above Recommend metabolic stone evaluation after current evaluation completed    Abbie Sons, MD  [facility] [address] [place], [place] [address] [phone]

## 2021-04-03 ENCOUNTER — Ambulatory Visit
Admission: RE | Admit: 2021-04-03 | Discharge: 2021-04-03 | Disposition: A | Discharge status: Home / Self Care | Payer: No Typology Code available for payment source | Source: Ambulatory Visit | Attending: Urology | Admitting: Urology

## 2021-04-03 ENCOUNTER — Other Ambulatory Visit: Payer: Self-pay

## 2021-04-03 DIAGNOSIS — N200 Calculus of kidney: Secondary | ICD-10-CM | POA: Diagnosis not present

## 2021-04-03 DIAGNOSIS — R109 Unspecified abdominal pain: Secondary | ICD-10-CM | POA: Diagnosis present

## 2021-04-06 ENCOUNTER — Encounter: Payer: Self-pay | Admitting: *Deleted

## 2021-04-08 ENCOUNTER — Encounter: Payer: Self-pay | Admitting: *Deleted

## 2021-05-11 ENCOUNTER — Encounter: Payer: Self-pay | Admitting: Internal Medicine

## 2021-05-11 DIAGNOSIS — N50812 Left testicular pain: Secondary | ICD-10-CM

## 2021-05-15 ENCOUNTER — Ambulatory Visit
Admission: RE | Admit: 2021-05-15 | Discharge: 2021-05-15 | Disposition: A | Discharge status: Home / Self Care | Payer: No Typology Code available for payment source | Source: Ambulatory Visit | Attending: Internal Medicine | Admitting: Internal Medicine

## 2021-05-15 DIAGNOSIS — N50812 Left testicular pain: Secondary | ICD-10-CM | POA: Insufficient documentation

## 2021-05-17 ENCOUNTER — Encounter: Payer: Self-pay | Admitting: Internal Medicine

## 2021-05-20 ENCOUNTER — Other Ambulatory Visit: Payer: Self-pay

## 2021-05-20 ENCOUNTER — Ambulatory Visit (INDEPENDENT_AMBULATORY_CARE_PROVIDER_SITE_OTHER): Payer: No Typology Code available for payment source | Admitting: Podiatry

## 2021-05-20 ENCOUNTER — Ambulatory Visit (INDEPENDENT_AMBULATORY_CARE_PROVIDER_SITE_OTHER): Payer: No Typology Code available for payment source

## 2021-05-20 ENCOUNTER — Encounter: Payer: Self-pay | Admitting: Podiatry

## 2021-05-20 DIAGNOSIS — M722 Plantar fascial fibromatosis: Secondary | ICD-10-CM

## 2021-05-20 MED ORDER — TRIAMCINOLONE ACETONIDE 40 MG/ML IJ SUSP
20.0000 mg | Freq: Once | INTRAMUSCULAR | Status: AC
  Administered 2021-05-20: 20 mg

## 2021-05-20 MED ORDER — MELOXICAM 15 MG PO TABS: 15.0000 mg | ORAL_TABLET | Freq: Every day | ORAL | 3 refills | Status: DC

## 2021-05-20 MED ORDER — METHYLPREDNISOLONE 4 MG PO TBPK: ORAL_TABLET | ORAL | 0 refills | Status: DC

## 2021-05-20 NOTE — Progress Notes (Signed)
?  Subjective:  ?Patient ID: Jeremy Johns, male    DOB: [birth year],  MRN: [id number] ?HPI ?Chief Complaint  ?Patient presents with  ? Foot Pain  ?  Plantar heel right - aching x several months, worsened in the last few weeks, AM pain, tried Ibuprofen and soaks  ? New Patient (Initial Visit)  ? ? ?61 y.o. male presents with the above complaint.  ? ?ROS: Denies fever chills nausea vomiting muscle aches pains calf pain back pain chest pain shortness of breath. ? ?Past Medical History:  ?Diagnosis Date  ? Allergic rhinitis   ? HTN (hypertension)   ? LBP (low back pain)   ? Osteoarthritis   ? ?Past Surgical History:  ?Procedure Laterality Date  ? EYE SURGERY Bilateral 2012  ? R cataract  ? ? ?Current Outpatient Medications:  ?  meloxicam (MOBIC) 15 MG tablet, Take 1 tablet (15 mg total) by mouth daily., Disp: 30 tablet, Rfl: 3 ?  methylPREDNISolone (MEDROL DOSEPAK) 4 MG TBPK tablet, 6 day dose pack - take as directed, Disp: 21 tablet, Rfl: 0 ?  Cholecalciferol (VITAMIN D3) 50 MCG (2000 UT) TABS, Take by mouth., Disp: , Rfl:  ?  losartan (COZAAR) 50 MG tablet, Take 1 tablet (50 mg total) by mouth daily., Disp: 90 tablet, Rfl: 3 ?  Multiple Vitamin (MULTIVITAMIN) tablet, Take 1 tablet by mouth daily., Disp: , Rfl:  ?  tamsulosin (FLOMAX) 0.4 MG CAPS capsule, Take 1 capsule (0.4 mg total) by mouth daily., Disp: 30 capsule, Rfl: 0 ? ?No Known Allergies ?Review of Systems ?Objective:  ?There were no vitals filed for this visit. ? ?General: Well developed, nourished, in no acute distress, alert and oriented x3  ? ?Dermatological: Skin is warm, dry and supple bilateral. Nails x 10 are well maintained; remaining integument appears unremarkable at this time. There are no open sores, no preulcerative lesions, no rash or signs of infection present. ? ?Vascular: Dorsalis Pedis artery and Posterior Tibial artery pedal pulses are 2/4 bilateral with immedate capillary fill time. Pedal hair growth present. No varicosities and no lower  extremity edema present bilateral.  ? ?Neruologic: Grossly intact via light touch bilateral. Vibratory intact via tuning fork bilateral. Protective threshold with Semmes Wienstein monofilament intact to all pedal sites bilateral. Patellar and Achilles deep tendon reflexes 2+ bilateral. No Babinski or clonus noted bilateral.  ? ?Musculoskeletal: No gross boney pedal deformities bilateral. No pain, crepitus, or limitation noted with foot and ankle range of motion bilateral. Muscular strength 5/5 in all groups tested bilateral.  Pain on palpation medial calcaneal tubercles bilateral. ? ?Gait: Unassisted, Nonantalgic.  ? ? ?Radiographs: ? ?Radiographs taken today demonstrate an osseously mature individual soft tissue increase in density plantar fascial Caney insertion site small plantar distally oriented calcaneal spur to the right heel.  No other significant abnormalities. ? ?Assessment & Plan:  ? ?Assessment: Planter fasciitis right. ? ?Plan: We discussed the etiology pathology conservative surgical therapies at this point went ahead and performed an injection to the right heel 20 mg Kenalog 5 mg Marcaine point maximal tenderness.  Tolerated procedure well without complications.  Started him on a Medrol Dosepak to be followed by meloxicam.  Placed him in a plantar fascial brace as well discussed appropriate shoe gear stretching exercises ice therapy.  May need to consider night splint next visit ? ? ? ? ?Jeremy Johns T. Fowler, DPM ?

## 2021-05-20 NOTE — Patient Instructions (Signed)

## 2021-05-28 ENCOUNTER — Encounter: Payer: Self-pay | Admitting: Urology

## 2021-05-28 ENCOUNTER — Ambulatory Visit: Payer: No Typology Code available for payment source | Admitting: Urology

## 2021-05-28 ENCOUNTER — Other Ambulatory Visit: Payer: Self-pay

## 2021-05-28 VITALS — BP 147/88 | HR 87 | Ht 66.0 in | Wt 180.0 lb

## 2021-05-28 DIAGNOSIS — N200 Calculus of kidney: Secondary | ICD-10-CM

## 2021-05-28 DIAGNOSIS — N5082 Scrotal pain: Secondary | ICD-10-CM | POA: Diagnosis not present

## 2021-05-28 MED ORDER — GABAPENTIN 100 MG PO CAPS: 200.0000 mg | ORAL_CAPSULE | Freq: Two times a day (BID) | ORAL | 0 refills | Status: DC

## 2021-05-28 NOTE — Progress Notes (Signed)
? ?05/28/2021 ?8:23 PM  ? ?Jeremy Johns ?April 23, 1960 ?[id number] ? ?Referring provider: Lorre Munroe, NP ?[address] ?[place],  [place] [address] ? ?Chief Complaint  ?Patient presents with  ? Follow-up  ? ? ?HPI: ?61 y.o. male presents for follow-up at the recommendations of her his PCP. ? ?I initially saw 03/11/2021 for a prior history of stone disease with a 6-week history of the left low back pain radiating to the left hemiscrotum ?Stone protocol CT was ordered which showed bilateral, nonobstructing renal calculi and no evidence of ureteral calculi or hydronephrosis.  A 1 year follow-up KUB was recommended ?He contacted office 04/06/2021 complaining of persistent dull left back pain radiating to the left hemiscrotum ?Symptoms are worse with prolonged sitting ?Scrotal sonogram performed 05/15/2021 showed no testicular abnormalities.  There were physiologic trace hydroceles noted ? ? ?PMH: ?Past Medical History:  ?Diagnosis Date  ? Allergic rhinitis   ? HTN (hypertension)   ? LBP (low back pain)   ? Osteoarthritis   ? ? ?Surgical History: ?Past Surgical History:  ?Procedure Laterality Date  ? EYE SURGERY Bilateral 2012  ? R cataract  ? ? ?Home Medications:  ?Allergies as of 05/28/2021   ?No Known Allergies ?  ? ?  ?Medication List  ?  ? ?  ? Accurate as of May 28, 2021  8:23 PM. If you have any questions, ask your nurse or doctor.  ?  ?  ? ?  ? ?losartan 50 MG tablet ?Commonly known as: COZAAR ?Take 1 tablet (50 mg total) by mouth daily. ?  ?meloxicam 15 MG tablet ?Commonly known as: MOBIC ?Take 1 tablet (15 mg total) by mouth daily. ?  ?methylPREDNISolone 4 MG Tbpk tablet ?Commonly known as: MEDROL DOSEPAK ?6 day dose pack - take as directed ?  ?multivitamin tablet ?Take 1 tablet by mouth daily. ?  ?tamsulosin 0.4 MG Caps capsule ?Commonly known as: FLOMAX ?Take 1 capsule (0.4 mg total) by mouth daily. ?  ?Vitamin D3 50 MCG (2000 UT) Tabs ?Take by mouth. ?  ? ?  ? ? ?Allergies: No Known Allergies ? ?Family  History: ?Family History  ?Problem Relation Age of Onset  ? Diabetes Mother   ? Hypertension Mother   ? Diabetes Father   ? Hypertension Father   ? Hypertension Unknown   ? Diabetes Unknown   ? ? ?Social History:  reports that he has never smoked. He has never used smokeless tobacco. He reports that he does not drink alcohol and does not use drugs. ? ? ?Physical Exam: ?BP (!) 147/88   Pulse 87   Ht 5\' 6"  (1.676 m)   Wt 180 lb (81.6 kg)   BMI 29.05 kg/m?   ?Constitutional:  Alert and oriented, No acute distress. ?HEENT: [place] AT, moist mucus membranes.  Trachea midline, no masses. ?Respiratory: Normal respiratory effort, no increased work of breathing. ?GU: Phallus without lesions.  Testes descended bilaterally without masses or tenderness.  No clinical hydrocele present.  Spermatic cord/epididymis palpably normal bilaterally ?Skin: No rashes, bruises or suspicious lesions. ?Psychiatric: Normal mood and affect. ? ? ?Assessment & Plan:   ? ?1.  Chronic left scrotal pain ?CT shows no evidence of a ureteral calculus which would be responsible for referred pain ?Unremarkable exam and scrotal ultrasound ?His pain may be neuropathic in etiology. ?Trial gabapentin 200 mg twice daily x30 days ?If no improvement would recommend PCP follow-up with consideration of lumbar MRI or orthopedic referral to assess for neuropathic scrotal pain  secondary to disc disease ? ? ?Riki Altes, MD ? ?[facility] ?[address] ?[place], [place] [address] ?[phone] ? ?

## 2021-06-17 ENCOUNTER — Encounter: Payer: No Typology Code available for payment source | Admitting: Podiatry

## 2021-06-29 ENCOUNTER — Ambulatory Visit: Payer: No Typology Code available for payment source | Admitting: Podiatry

## 2021-06-29 DIAGNOSIS — M722 Plantar fascial fibromatosis: Secondary | ICD-10-CM | POA: Diagnosis not present

## 2021-06-29 NOTE — Progress Notes (Signed)
He presents today for follow-up of his Planter fasciitis he states that is about 90% better continues to wear a plantar fascia brace and to take his meloxicam. ? ?Objective: No reproducible pain right foot. ? ?Assessment: Resolving Planter fasciitis. ? ?Plan: Recommended he continue all conservative therapies and follow-up with me in 1 month if necessary ? ? ?

## 2021-07-27 ENCOUNTER — Encounter: Payer: Self-pay | Admitting: Podiatry

## 2021-07-27 ENCOUNTER — Ambulatory Visit: Payer: No Typology Code available for payment source | Admitting: Podiatry

## 2021-07-27 DIAGNOSIS — M722 Plantar fascial fibromatosis: Secondary | ICD-10-CM | POA: Diagnosis not present

## 2021-07-27 NOTE — Progress Notes (Signed)
He states that his Planter fasciitis is flared up again he states that seems to be doing much better now than it was when it flared up continues to take his meloxicam regularly.  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  States that he noticed the outside of the foot really started to hurt the other week but now has calmed down considerably.  Objective: Vital signs are stable he is alert and oriented x3.  Pulses are palpable.  Still with some tenderness on palpation medial calcaneal tubercle central calcaneal tubercle at the plantar fascia calcaneal insertion some point as well.  No pain on palpation lateral aspect of the right foot.  Assessment: Planter fasciitis cannot rule out a plantar fascial tear with lateral compensatory syndrome.  Plan: At this point since it is starting to feel better I think an orthotic is probably going to be his best bet.  He just needs a custom pair of orthotics for his work shoes placing him in a neutral position.  I will follow-up with him once those come in or sooner if needed.  If he decides not to get orthotics I will follow-up with him for MRI.

## 2021-08-10 ENCOUNTER — Ambulatory Visit: Payer: No Typology Code available for payment source

## 2021-08-24 ENCOUNTER — Other Ambulatory Visit: Payer: No Typology Code available for payment source

## 2021-09-20 ENCOUNTER — Other Ambulatory Visit: Payer: Self-pay | Admitting: Podiatry

## 2021-09-29 ENCOUNTER — Other Ambulatory Visit: Payer: Self-pay | Admitting: Internal Medicine

## 2021-09-30 NOTE — Telephone Encounter (Signed)
Requested medication (s) are due for refill today: yes  Requested medication (s) are on the active medication list: yes  Last refill:  10/10/20 #90/3  Future visit scheduled: yes  Notes to clinic:  Unable to refill per protocol due to failed labs, no updated results.      Requested Prescriptions  Pending Prescriptions Disp Refills   losartan (COZAAR) 50 MG tablet [Pharmacy Med Name: LOSARTAN POTASSIUM 50 MG TAB] 90 tablet 3    Sig: TAKE 1 TABLET BY MOUTH EVERY DAY     Cardiovascular:  Angiotensin Receptor Blockers Failed - 09/29/2021  2:19 AM      Failed - Cr in normal range and within 180 days    Creatinine, Ser  Date Value Ref Range Status  05/20/2020 0.88 0.40 - 1.50 mg/dL Final         Failed - K in normal range and within 180 days    Potassium  Date Value Ref Range Status  05/20/2020 4.2 3.5 - 5.1 mEq/L Final         Failed - Last BP in normal range    BP Readings from Last 1 Encounters:  05/28/21 (!) 147/88         Failed - Valid encounter within last 6 months    Recent Outpatient Visits           7 months ago Left flank pain   [facility] [place], Salvadore Oxford, NP   12 months ago Essential hypertension   [facility] [place], [place] W, NP   1 year ago Overweight with body mass index (BMI) of 29 to 29.9 in adult   [facility] [place], Salvadore Oxford, NP       Future Appointments             In 1 week Sampson Si, Salvadore Oxford, NP [facility]   In 6 months Stoioff, Verna Czech, MD [facility]            Passed - Patient is not pregnant

## 2021-10-13 ENCOUNTER — Encounter: Payer: Self-pay | Admitting: Internal Medicine

## 2021-10-13 ENCOUNTER — Ambulatory Visit: Payer: No Typology Code available for payment source | Admitting: Internal Medicine

## 2021-10-13 VITALS — BP 147/89 | HR 82 | Ht 66.0 in | Wt 177.8 lb

## 2021-10-13 DIAGNOSIS — Z6828 Body mass index (BMI) 28.0-28.9, adult: Secondary | ICD-10-CM

## 2021-10-13 DIAGNOSIS — Z125 Encounter for screening for malignant neoplasm of prostate: Secondary | ICD-10-CM

## 2021-10-13 DIAGNOSIS — Z0001 Encounter for general adult medical examination with abnormal findings: Secondary | ICD-10-CM

## 2021-10-13 DIAGNOSIS — M545 Low back pain, unspecified: Secondary | ICD-10-CM

## 2021-10-13 DIAGNOSIS — I10 Essential (primary) hypertension: Secondary | ICD-10-CM | POA: Diagnosis not present

## 2021-10-13 DIAGNOSIS — E663 Overweight: Secondary | ICD-10-CM

## 2021-10-13 MED ORDER — METHOCARBAMOL 500 MG PO TABS: 500.0000 mg | ORAL_TABLET | Freq: Every evening | ORAL | 0 refills | Status: DC | PRN

## 2021-10-13 NOTE — Assessment & Plan Note (Signed)
Encourage diet and exercise for weight loss 

## 2021-10-13 NOTE — Assessment & Plan Note (Signed)
Manual repeat more elevated than prior Increase losartan to 75 mg daily Reinforced DASH diet and exercise weight loss C-Met today

## 2021-10-13 NOTE — Patient Instructions (Signed)
Health Maintenance, Male Adopting a healthy lifestyle and getting preventive care are important in promoting health and wellness. Ask your health care provider about: The right schedule for you to have regular tests and exams. Things you can do on your own to prevent diseases and keep yourself healthy. What should I know about diet, weight, and exercise? Eat a healthy diet  Eat a diet that includes plenty of vegetables, fruits, low-fat dairy products, and lean protein. Do not eat a lot of foods that are high in solid fats, added sugars, or sodium. Maintain a healthy weight Body mass index (BMI) is a measurement that can be used to identify possible weight problems. It estimates body fat based on height and weight. Your health care provider can help determine your BMI and help you achieve or maintain a healthy weight. Get regular exercise Get regular exercise. This is one of the most important things you can do for your health. Most adults should: Exercise for at least 150 minutes each week. The exercise should increase your heart rate and make you sweat (moderate-intensity exercise). Do strengthening exercises at least twice a week. This is in addition to the moderate-intensity exercise. Spend less time sitting. Even light physical activity can be beneficial. Watch cholesterol and blood lipids Have your blood tested for lipids and cholesterol at 60 years of age, then have this test every 5 years. You may need to have your cholesterol levels checked more often if: Your lipid or cholesterol levels are high. You are older than 60 years of age. You are at high risk for heart disease. What should I know about cancer screening? Many types of cancers can be detected early and may often be prevented. Depending on your health history and family history, you may need to have cancer screening at various ages. This may include screening for: Colorectal cancer. Prostate cancer. Skin cancer. Lung  cancer. What should I know about heart disease, diabetes, and high blood pressure? Blood pressure and heart disease High blood pressure causes heart disease and increases the risk of stroke. This is more likely to develop in people who have high blood pressure readings or are overweight. Talk with your health care provider about your target blood pressure readings. Have your blood pressure checked: Every 3-5 years if you are 18-39 years of age. Every year if you are 40 years old or older. If you are between the ages of 65 and 75 and are a current or former smoker, ask your health care provider if you should have a one-time screening for abdominal aortic aneurysm (AAA). Diabetes Have regular diabetes screenings. This checks your fasting blood sugar level. Have the screening done: Once every three years after age 45 if you are at a normal weight and have a low risk for diabetes. More often and at a younger age if you are overweight or have a high risk for diabetes. What should I know about preventing infection? Hepatitis B If you have a higher risk for hepatitis B, you should be screened for this virus. Talk with your health care provider to find out if you are at risk for hepatitis B infection. Hepatitis C Blood testing is recommended for: Everyone born from 1945 through 1965. Anyone with known risk factors for hepatitis C. Sexually transmitted infections (STIs) You should be screened each year for STIs, including gonorrhea and chlamydia, if: You are sexually active and are younger than 60 years of age. You are older than 60 years of age and your   health care provider tells you that you are at risk for this type of infection. Your sexual activity has changed since you were last screened, and you are at increased risk for chlamydia or gonorrhea. Ask your health care provider if you are at risk. Ask your health care provider about whether you are at high risk for HIV. Your health care provider  may recommend a prescription medicine to help prevent HIV infection. If you choose to take medicine to prevent HIV, you should first get tested for HIV. You should then be tested every 3 months for as long as you are taking the medicine. Follow these instructions at home: Alcohol use Do not drink alcohol if your health care provider tells you not to drink. If you drink alcohol: Limit how much you have to 0-2 drinks a day. Know how much alcohol is in your drink. In the [place] one drink equals one 12 oz bottle of beer (355 mL), one 5 oz glass of wine (148 mL), or one 1 oz glass of hard liquor (44 mL). Lifestyle Do not use any products that contain nicotine or tobacco. These products include cigarettes, chewing tobacco, and vaping devices, such as e-cigarettes. If you need help quitting, ask your health care provider. Do not use street drugs. Do not share needles. Ask your health care provider for help if you need support or information about quitting drugs. General instructions Schedule regular health, dental, and eye exams. Stay current with your vaccines. Tell your health care provider if: You often feel depressed. You have ever been abused or do not feel safe at home. Summary Adopting a healthy lifestyle and getting preventive care are important in promoting health and wellness. Follow your health care provider's instructions about healthy diet, exercising, and getting tested or screened for diseases. Follow your health care provider's instructions on monitoring your cholesterol and blood pressure. This information is not intended to replace advice given to you by your health care provider. Make sure you discuss any questions you have with your health care provider. Document Revised: 07/14/2020 Document Reviewed: 07/14/2020 Elsevier Patient Education  2023 Elsevier Inc.  

## 2021-10-13 NOTE — Progress Notes (Signed)
Subjective:    Patient ID: Jeremy Johns, male    DOB: 09-04-1960, 61 y.o.   MRN: [id number]  HPI  Patient presents the clinic today for his annual exam.  Of note, his BP today is 147/89.  He is taking Losartan as prescribed.  Flu: never Tetanus: 11/2017 COVID:never Shingrix: never PSA screening: 05/2020 Colon screening: never Vision screening: annually Dentist: biannually  Diet: He does eat meat. He consume fruits and veggies. He tries to avoid fried foods. He drinks mostly water, soda, tea Exercise: None  Review of Systems  Past Medical History:  Diagnosis Date   Allergic rhinitis    HTN (hypertension)    LBP (low back pain)    Osteoarthritis     Current Outpatient Medications  Medication Sig Dispense Refill   Cholecalciferol (VITAMIN D3) 50 MCG (2000 UT) TABS Take by mouth.     gabapentin (NEURONTIN) 100 MG capsule Take 2 capsules (200 mg total) by mouth 2 (two) times daily. 120 capsule 0   losartan (COZAAR) 50 MG tablet TAKE 1 TABLET BY MOUTH EVERY DAY 30 tablet 0   meloxicam (MOBIC) 15 MG tablet TAKE 1 TABLET (15 MG TOTAL) BY MOUTH DAILY. 30 tablet 3   Multiple Vitamin (MULTIVITAMIN) tablet Take 1 tablet by mouth daily.     tamsulosin (FLOMAX) 0.4 MG CAPS capsule Take 1 capsule (0.4 mg total) by mouth daily. 30 capsule 0   No current facility-administered medications for this visit.    No Known Allergies  Family History  Problem Relation Age of Onset   Diabetes Mother    Hypertension Mother    Diabetes Father    Hypertension Father    Hypertension Unknown    Diabetes Unknown     Social History   Socioeconomic History   Marital status: Married    Spouse name: Not on file   Number of children: Not on file   Years of education: Not on file   Highest education level: Not on file  Occupational History   Not on file  Tobacco Use   Smoking status: Never   Smokeless tobacco: Never  Substance and Sexual Activity   Alcohol use: No   Drug use: No    Sexual activity: Yes  Other Topics Concern   Not on file  Social History Narrative   Not on file   Social Determinants of Health   Financial Resource Strain: Not on file  Food Insecurity: Not on file  Transportation Needs: Not on file  Physical Activity: Not on file  Stress: Not on file  Social Connections: Not on file  Intimate Partner Violence: Not on file     Constitutional: Denies fever, malaise, fatigue, headache or abrupt weight changes.  HEENT: Denies eye pain, eye redness, ear pain, ringing in the ears, wax buildup, runny nose, nasal congestion, bloody nose, or sore throat. Respiratory: Denies difficulty breathing, shortness of breath, cough or sputum production.   Cardiovascular: Denies chest pain, chest tightness, palpitations or swelling in the hands or feet.  Gastrointestinal: Denies abdominal pain, bloating, constipation, diarrhea or blood in the stool.  GU: Denies urgency, frequency, pain with urination, burning sensation, blood in urine, odor or discharge. Musculoskeletal: Patient reports joint pain, lowe back pain.  Denies decrease in range of motion, difficulty with gait, or joint swelling.  Skin: Denies redness, rashes, lesions or ulcercations.  Neurological: Denies dizziness, difficulty with memory, difficulty with speech or problems with balance and coordination.  Psych: Denies anxiety, depression, SI/HI.  No  other specific complaints in a complete review of systems (except as listed in HPI above).     Objective:   Physical Exam BP (!) 147/89   Pulse 82   Ht _0  (1.676 m)   Wt 177 lb 12.8 oz (80.6 kg)   SpO2 100%   BMI 28.70 kg/m   Wt Readings from Last 3 Encounters:  05/28/21 180 lb (81.6 kg)  03/11/21 175 lb (79.4 kg)  02/17/21 183 lb (83 kg)    General: Appears his over stated age, weight, in NAD. Skin: Warm, dry and intact.  HEENT: Head: normal shape and size; Eyes: sclera white, no icterus, conjunctiva pink, PERRLA and EOMs intact;  Neck:   Neck supple, trachea midline. No masses, lumps or thyromegaly present.  Cardiovascular: Normal rate and rhythm. S1,S2 noted.  No murmur, rubs or gallops noted. No JVD or BLE edema. No carotid bruits noted. Pulmonary/Chest: Normal effort and positive vesicular breath sounds. No respiratory distress. No wheezes, rales or ronchi noted.  Abdomen: Normal bowel sounds. Musculoskeletal: Strength 5/5 BUE/BLE.  No difficulty with gait.  Neurological: Alert and oriented. Cranial nerves II-XII grossly intact. Coordination normal.  Psychiatric: Mood and affect normal. Behavior is normal. Judgment and thought content normal.    BMET    Component Value Date/Time   NA 139 05/20/2020 0839   K 4.2 05/20/2020 0839   CL 103 05/20/2020 0839   CO2 30 05/20/2020 0839   GLUCOSE 91 05/20/2020 0839   BUN 20 05/20/2020 0839   CREATININE 0.88 05/20/2020 0839   CALCIUM 10.0 05/20/2020 0839   GFRNONAA 87.98 07/30/2009 0815   GFRAA 104 07/11/2007 1017    Lipid Panel     Component Value Date/Time   CHOL 162 05/20/2020 0839   TRIG 82.0 05/20/2020 0839   HDL 47.30 05/20/2020 0839   CHOLHDL 3 05/20/2020 0839   VLDL 16.4 05/20/2020 0839   LDLCALC 99 05/20/2020 0839    CBC    Component Value Date/Time   WBC 6.7 05/20/2020 0839   RBC 5.64 05/20/2020 0839   HGB 17.0 05/20/2020 0839   HCT 50.4 05/20/2020 0839   PLT 305.0 05/20/2020 0839   MCV 89.4 05/20/2020 0839   MCHC 33.7 05/20/2020 0839   RDW 13.9 05/20/2020 0839   LYMPHSABS 2.5 01/10/2012 0841   MONOABS 0.5 01/10/2012 0841   EOSABS 0.1 01/10/2012 0841   BASOSABS 0.0 01/10/2012 0841    Hgb A1C Lab Results  Component Value Date   HGBA1C 5.6 05/20/2020          Assessment & Plan:   Preventative Health Maintenance:  Encouraged him to get a flu shot in the fall Tetanus UTD Encouraged him to get a COVID-vaccine Discussed Shingrix vaccine, he will check coverage with his insurance company schedule a nurse visit if he would like to have  this done He declines referral to GI for screening colonoscopy Encouraged him to consume a balanced diet and exercise regimen Advised him to see an eye doctor and dentist annually We will check CBC, c-Met, lipid, A1c and PSA today  Low Back Pain:  Encourage stretching and ice Rx for Methocarbamol 500 mg nightly as needed--sedation caution given  RTC in 2 weeks, follow-up HTN 6 months, follow-up chronic conditions Webb Silversmith, NP

## 2021-10-14 LAB — COMPLETE METABOLIC PANEL WITH GFR
AG Ratio: 1.7 (calc) (ref 1.0–2.5)
ALT: 23 U/L (ref 9–46)
AST: 15 U/L (ref 10–35)
Albumin: 4.7 g/dL (ref 3.6–5.1)
Alkaline phosphatase (APISO): 57 U/L (ref 35–144)
BUN: 23 mg/dL (ref 7–25)
CO2: 27 mmol/L (ref 20–32)
Calcium: 9.7 mg/dL (ref 8.6–10.3)
Chloride: 105 mmol/L (ref 98–110)
Creat: 0.95 mg/dL (ref 0.70–1.30)
Globulin: 2.8 g/dL (calc) (ref 1.9–3.7)
Glucose, Bld: 89 mg/dL (ref 65–139)
Potassium: 4.2 mmol/L (ref 3.5–5.3)
Sodium: 139 mmol/L (ref 135–146)
Total Bilirubin: 0.4 mg/dL (ref 0.2–1.2)
Total Protein: 7.5 g/dL (ref 6.1–8.1)
eGFR: 92 mL/min/{1.73_m2} (ref >=60)

## 2021-10-14 LAB — LIPID PANEL
Cholesterol: 185 mg/dL (ref <200)
HDL: 48 mg/dL (ref >=40)
LDL Cholesterol (Calc): 112 mg/dL (calc) — ABNORMAL HIGH
Non-HDL Cholesterol (Calc): 137 mg/dL (calc) — ABNORMAL HIGH (ref <130)
Total CHOL/HDL Ratio: 3.9 (calc) (ref <5.0)
Triglycerides: 133 mg/dL (ref <150)

## 2021-10-14 LAB — CBC
HCT: 48.6 % (ref 38.5–50.0)
Hemoglobin: 16.5 g/dL (ref 13.2–17.1)
MCH: 30.3 pg (ref 27.0–33.0)
MCHC: 34 g/dL (ref 32.0–36.0)
MCV: 89.2 fL (ref 80.0–100.0)
MPV: 10.2 fL (ref 7.5–12.5)
Platelets: 295 10*3/uL (ref 140–400)
RBC: 5.45 10*6/uL (ref 4.20–5.80)
RDW: 12.5 % (ref 11.0–15.0)
WBC: 8.8 10*3/uL (ref 3.8–10.8)

## 2021-10-14 LAB — HEMOGLOBIN A1C
Hgb A1c MFr Bld: 5.4 % of total Hgb (ref <5.7)
Mean Plasma Glucose: 108 mg/dL
eAG (mmol/L): 6 mmol/L

## 2021-10-14 LAB — PSA: PSA: 2.8 ng/mL (ref <=4.00)

## 2021-10-20 ENCOUNTER — Other Ambulatory Visit: Payer: Self-pay | Admitting: Internal Medicine

## 2021-10-20 MED ORDER — LOSARTAN POTASSIUM 50 MG PO TABS: 50.0000 mg | ORAL_TABLET | Freq: Every day | ORAL | 0 refills | Status: DC

## 2021-10-21 ENCOUNTER — Encounter: Payer: Self-pay | Admitting: Internal Medicine

## 2021-10-21 MED ORDER — LOSARTAN POTASSIUM 50 MG PO TABS: 75.0000 mg | ORAL_TABLET | Freq: Every day | ORAL | 0 refills | Status: DC

## 2021-10-29 ENCOUNTER — Encounter: Payer: Self-pay | Admitting: Urology

## 2021-11-05 ENCOUNTER — Ambulatory Visit: Payer: No Typology Code available for payment source | Admitting: Internal Medicine

## 2021-11-05 ENCOUNTER — Encounter: Payer: Self-pay | Admitting: Internal Medicine

## 2021-11-05 VITALS — BP 136/86 | HR 78 | Temp 97.1°F | Wt 176.0 lb

## 2021-11-05 DIAGNOSIS — E663 Overweight: Secondary | ICD-10-CM

## 2021-11-05 DIAGNOSIS — Z6828 Body mass index (BMI) 28.0-28.9, adult: Secondary | ICD-10-CM | POA: Diagnosis not present

## 2021-11-05 DIAGNOSIS — I10 Essential (primary) hypertension: Secondary | ICD-10-CM

## 2021-11-05 NOTE — Progress Notes (Signed)
Subjective:    Patient ID: Jeremy Johns, male    DOB: April 22, 1960, 61 y.o.   MRN: [id number]  HPI  Patient presents to clinic today for 2-week follow-up of HTN.  At his last visit, his Losartan was increased to 75 mg daily.  He has been taking the medication as prescribed.  His BP today is 136/86.  There is no ECG on file.  Review of Systems     Past Medical History:  Diagnosis Date   Allergic rhinitis    HTN (hypertension)    LBP (low back pain)    Osteoarthritis     Current Outpatient Medications  Medication Sig Dispense Refill   Cholecalciferol (VITAMIN D3) 50 MCG (2000 UT) TABS Take by mouth.     gabapentin (NEURONTIN) 100 MG capsule Take 2 capsules (200 mg total) by mouth 2 (two) times daily. 120 capsule 0   losartan (COZAAR) 50 MG tablet Take 1.5 tablets (75 mg total) by mouth daily. 135 tablet 0   meloxicam (MOBIC) 15 MG tablet TAKE 1 TABLET (15 MG TOTAL) BY MOUTH DAILY. 30 tablet 3   methocarbamol (ROBAXIN) 500 MG tablet Take 1 tablet (500 mg total) by mouth at bedtime as needed for muscle spasms. 15 tablet 0   Multiple Vitamin (MULTIVITAMIN) tablet Take 1 tablet by mouth daily.     tamsulosin (FLOMAX) 0.4 MG CAPS capsule Take 1 capsule (0.4 mg total) by mouth daily. 30 capsule 0   No current facility-administered medications for this visit.    No Known Allergies  Family History  Problem Relation Age of Onset   Diabetes Mother    Hypertension Mother    Diabetes Father    Hypertension Father    Hypertension Unknown    Diabetes Unknown     Social History   Socioeconomic History   Marital status: Married    Spouse name: Not on file   Number of children: Not on file   Years of education: Not on file   Highest education level: Not on file  Occupational History   Not on file  Tobacco Use   Smoking status: Never   Smokeless tobacco: Never  Substance and Sexual Activity   Alcohol use: No   Drug use: No   Sexual activity: Yes  Other Topics Concern    Not on file  Social History Narrative   Not on file   Social Determinants of Health   Financial Resource Strain: Not on file  Food Insecurity: Not on file  Transportation Needs: Not on file  Physical Activity: Not on file  Stress: Not on file  Social Connections: Not on file  Intimate Partner Violence: Not on file     Constitutional: Denies fever, malaise, fatigue, headache or abrupt weight changes.  Respiratory: Denies difficulty breathing, shortness of breath, cough or sputum production.   Cardiovascular: Denies chest pain, chest tightness, palpitations or swelling in the hands or feet.  Neurological: Denies dizziness, difficulty with memory, difficulty with speech or problems with balance and coordination.    No other specific complaints in a complete review of systems (except as listed in HPI above).  Objective:   Physical Exam  BP 136/86 (BP Location: Left Arm, Patient Position: Sitting, Cuff Size: Normal)   Pulse 78   Temp (!) 97.1 F (36.2 C) (Temporal)   Wt 176 lb (79.8 kg)   SpO2 99%   BMI 28.41 kg/m   Wt Readings from Last 3 Encounters:  10/13/21 177 lb 12.8 oz (80.6  kg)  05/28/21 180 lb (81.6 kg)  03/11/21 175 lb (79.4 kg)    General: Appears his stated age, overweight, in NAD. Cardiovascular: Normal rate and rhythm. S1,S2 noted.  No murmur, rubs or gallops noted. No JVD or BLE edema.  Pulmonary/Chest: Normal effort and positive vesicular breath sounds. No respiratory distress. No wheezes, rales or ronchi noted.  Neurological: Alert and oriented.   BMET    Component Value Date/Time   NA 139 10/13/2021 1537   K 4.2 10/13/2021 1537   CL 105 10/13/2021 1537   CO2 27 10/13/2021 1537   GLUCOSE 89 10/13/2021 1537   BUN 23 10/13/2021 1537   CREATININE 0.95 10/13/2021 1537   CALCIUM 9.7 10/13/2021 1537   GFRNONAA 87.98 07/30/2009 0815   GFRAA 104 07/11/2007 1017    Lipid Panel     Component Value Date/Time   CHOL 185 10/13/2021 1537   TRIG 133  10/13/2021 1537   HDL 48 10/13/2021 1537   CHOLHDL 3.9 10/13/2021 1537   VLDL 16.4 05/20/2020 0839   LDLCALC 112 (H) 10/13/2021 1537    CBC    Component Value Date/Time   WBC 8.8 10/13/2021 1537   RBC 5.45 10/13/2021 1537   HGB 16.5 10/13/2021 1537   HCT 48.6 10/13/2021 1537   PLT 295 10/13/2021 1537   MCV 89.2 10/13/2021 1537   MCH 30.3 10/13/2021 1537   MCHC 34.0 10/13/2021 1537   RDW 12.5 10/13/2021 1537   LYMPHSABS 2.5 01/10/2012 0841   MONOABS 0.5 01/10/2012 0841   EOSABS 0.1 01/10/2012 0841   BASOSABS 0.0 01/10/2012 0841    Hgb A1C Lab Results  Component Value Date   HGBA1C 5.4 10/13/2021           Assessment & Plan:     RTC in 6 months for follow-up of chronic conditions Nicki Reaper, NP

## 2021-11-05 NOTE — Assessment & Plan Note (Signed)
Controlled with increased dose of losartan Reinforced DASH diet and exercise weight loss

## 2021-11-05 NOTE — Patient Instructions (Signed)

## 2021-11-05 NOTE — Assessment & Plan Note (Signed)
Encourage diet and exercise for weight loss 

## 2021-11-28 ENCOUNTER — Other Ambulatory Visit: Payer: Self-pay | Admitting: Internal Medicine

## 2021-11-30 NOTE — Telephone Encounter (Signed)
Requested medications are due for refill today.  yes  Requested medications are on the active medications list.  yes  Last refill. 10/13/2021 #15  Future visit scheduled.   yes  Notes to clinic.  Refill is not delegated.    Requested Prescriptions  Pending Prescriptions Disp Refills   methocarbamol (ROBAXIN) 500 MG tablet [Pharmacy Med Name: METHOCARBAMOL 500 MG TABLET] 15 tablet 0    Sig: TAKE 1 TABLET (500 MG TOTAL) BY MOUTH EVERY DAY AT BEDTIME AS NEEDED FOR MUSCLE SPASM     Not Delegated - Analgesics:  Muscle Relaxants Failed - 11/28/2021 11:33 AM      Failed - This refill cannot be delegated      Passed - Valid encounter within last 6 months    Recent Outpatient Visits           3 weeks ago Essential hypertension   [place], Coralie Keens, NP   1 month ago Encounter for routine adult medical exam with abnormal findings   [facility] [place], Coralie Keens, NP   9 months ago Left flank pain   [facility] [place], Coralie Keens, NP   1 year ago Essential hypertension   [facility] [place], [place], [place]   1 year ago Overweight with body mass index (BMI) of 29 to 29.9 in adult   [place], Coralie Keens, NP       Future Appointments             In 4 months Stoioff, Ronda Fairly, MD [place]   In 6 months [place], Coralie Keens, NP [facility], [facility]

## 2021-12-03 ENCOUNTER — Encounter: Payer: Self-pay | Admitting: Internal Medicine

## 2021-12-04 ENCOUNTER — Encounter: Payer: Self-pay | Admitting: Physician Assistant

## 2021-12-04 ENCOUNTER — Encounter: Payer: Self-pay | Admitting: Urology

## 2021-12-04 ENCOUNTER — Ambulatory Visit (INDEPENDENT_AMBULATORY_CARE_PROVIDER_SITE_OTHER): Payer: No Typology Code available for payment source | Admitting: Physician Assistant

## 2021-12-04 ENCOUNTER — Ambulatory Visit
Admission: RE | Admit: 2021-12-04 | Discharge: 2021-12-04 | Disposition: A | Discharge status: Home / Self Care | Payer: No Typology Code available for payment source | Source: Ambulatory Visit | Attending: Physician Assistant | Admitting: Physician Assistant

## 2021-12-04 VITALS — BP 152/87 | HR 92 | Ht 66.0 in | Wt 179.2 lb

## 2021-12-04 DIAGNOSIS — M542 Cervicalgia: Secondary | ICD-10-CM

## 2021-12-04 DIAGNOSIS — M545 Low back pain, unspecified: Secondary | ICD-10-CM | POA: Diagnosis not present

## 2021-12-04 NOTE — Patient Instructions (Addendum)
Based on your symptoms and physical exam I believe the following is the cause of your concern today  I recommend the following at this time to help relieve that discomfort:  Rest Warm compresses to the area (20 minutes on, minimum of 30 minutes off) You can alternate Tylenol and Ibuprofen for pain management but Ibuprofen is typically preferred to reduce inflammation.  If you like you can use the Meloxicam as your NSAID- no other NSAIDs while using this You can use the Robaxin for night time relief as this can help with sleep.  Gentle stretches and exercises that I have included in your paperwork Try to reduce excess strain to the area and rest as much as possible  Wear supportive shoes and, if you must lift anything, use proper lifting techniques that spare your back.   If these measures do not lead to improvement in your symptoms over the next 2-4 weeks please let us know    Please go here for your imaging   [place] [address] [place], [place] [address]
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
## 2021-12-05 ENCOUNTER — Encounter: Payer: Self-pay | Admitting: Internal Medicine

## 2021-12-05 DIAGNOSIS — R0981 Nasal congestion: Secondary | ICD-10-CM

## 2021-12-07 MED ORDER — AZITHROMYCIN 250 MG PO TABS: ORAL_TABLET | ORAL | 0 refills | Status: AC

## 2021-12-07 NOTE — Telephone Encounter (Signed)
Unfortunately, she made no mention of his URI symptoms in her note.  This is something he will have to be seen for.  Virtual is fine.

## 2021-12-07 NOTE — Addendum Note (Signed)
Addended by: Talitha Givens on: 12/07/2021 03:03 PM   Modules accepted: Orders

## 2022-01-11 ENCOUNTER — Ambulatory Visit: Payer: 59 | Admitting: Internal Medicine

## 2022-01-11 ENCOUNTER — Encounter: Payer: Self-pay | Admitting: Internal Medicine

## 2022-01-11 VITALS — BP 136/84 | HR 79 | Temp 97.1°F | Wt 176.0 lb

## 2022-01-11 DIAGNOSIS — Z1211 Encounter for screening for malignant neoplasm of colon: Secondary | ICD-10-CM | POA: Diagnosis not present

## 2022-01-11 DIAGNOSIS — J329 Chronic sinusitis, unspecified: Secondary | ICD-10-CM | POA: Diagnosis not present

## 2022-01-11 NOTE — Patient Instructions (Signed)

## 2022-01-11 NOTE — Progress Notes (Signed)
HPI  Pt presents to the clinic today left sided facial pressure, left ear fullness, left side dental pain, nasal congestion. This started a few months ago. He describes the ear pain as pressure. He denies difficulty chewing. He is not blowing anything out of his nose. He denies fever, chills or body aches. He was treated with a Azithromycin 1 month ago, but this did not provide and relief. He did see ENT for the same, is currently on Prednisone but it does not seem to be helping.  He has a follow-up with ENT in a few weeks.  Review of Systems     Past Medical History:  Diagnosis Date   Allergic rhinitis    HTN (hypertension)    LBP (low back pain)    Osteoarthritis     Family History  Problem Relation Age of Onset   Diabetes Mother    Hypertension Mother    Diabetes Father    Hypertension Father    Hypertension Unknown    Diabetes Unknown     Social History   Socioeconomic History   Marital status: Married    Spouse name: Not on file   Number of children: Not on file   Years of education: Not on file   Highest education level: Not on file  Occupational History   Not on file  Tobacco Use   Smoking status: Never   Smokeless tobacco: Never  Substance and Sexual Activity   Alcohol use: No   Drug use: No   Sexual activity: Yes  Other Topics Concern   Not on file  Social History Narrative   Not on file   Social Determinants of Health   Financial Resource Strain: Not on file  Food Insecurity: Not on file  Transportation Needs: Not on file  Physical Activity: Not on file  Stress: Not on file  Social Connections: Not on file  Intimate Partner Violence: Not on file    No Known Allergies   Constitutional:Denies headache, fatigue, fever or abrupt weight changes.  HEENT:  Positive facial pressure, nasal congestion, ear fullness and dental pain denies eye redness, ear pain, ringing in the ears, wax buildup, runny nose or bloody nose. Respiratory: Denies cough,  difficulty breathing or shortness of breath.  Cardiovascular: Denies chest pain, chest tightness, palpitations or swelling in the hands or feet.   No other specific complaints in a complete review of systems (except as listed in HPI above).  Objective:   BP 136/84 (BP Location: Right Arm, Patient Position: Sitting, Cuff Size: Normal)   Pulse 79   Temp (!) 97.1 F (36.2 C) (Temporal)   Wt 176 lb (79.8 kg)   SpO2 99%   BMI 28.41 kg/m    General: Appears his stated age, well developed, well nourished in NAD. HEENT: Head: normal shape and size, no sinus tenderness noted; Eyes: sclera white, no icterus, conjunctiva pink; Ears: Tm's gray and intact, normal light reflex; Nose: mucosa pink and moist, septum midline; Throat/Mouth: Teeth present, mucosa pink and moist, no exudate noted, no lesions or ulcerations noted.  Neck:  No adenopathy noted.  Cardiovascular: Normal rate and rhythm. S1,S2 noted.  No murmur, rubs or gallops noted.  Pulmonary/Chest: Normal effort and positive vesicular breath sounds. No respiratory distress. No wheezes, rales or ronchi noted.       Assessment & Plan:   Recurrent Sinusitis  Can use a Neti Pot which can be purchased from your local drug store. Flonase 2 sprays each nostril for 3 days  and then as needed. Continue Pred taper as previously prescribed We will obtain CT sinuses for further evaluation Follow-up with ENT as scheduled  RTC in 4 months for follow-up of chronic conditions Nicki Reaper, NP

## 2022-01-14 ENCOUNTER — Ambulatory Visit: Payer: No Typology Code available for payment source

## 2022-01-16 ENCOUNTER — Other Ambulatory Visit: Payer: Self-pay | Admitting: Internal Medicine

## 2022-01-18 ENCOUNTER — Encounter: Payer: Self-pay | Admitting: Internal Medicine

## 2022-01-18 ENCOUNTER — Ambulatory Visit
Admission: RE | Admit: 2022-01-18 | Discharge: 2022-01-18 | Disposition: A | Discharge status: Home / Self Care | Payer: 59 | Source: Ambulatory Visit | Attending: Internal Medicine | Admitting: Internal Medicine

## 2022-01-18 DIAGNOSIS — J329 Chronic sinusitis, unspecified: Secondary | ICD-10-CM | POA: Insufficient documentation

## 2022-01-18 NOTE — Telephone Encounter (Signed)
Requested Prescriptions  Pending Prescriptions Disp Refills   losartan (COZAAR) 50 MG tablet [Pharmacy Med Name: LOSARTAN POTASSIUM 50 MG TAB] 135 tablet 0    Sig: TAKE 1 AND 1/2 TABLETS BY MOUTH DAILY     Cardiovascular:  Angiotensin Receptor Blockers Passed - 01/16/2022  8:57 AM      Passed - Cr in normal range and within 180 days    Creat  Date Value Ref Range Status  10/13/2021 0.95 0.70 - 1.30 mg/dL Final         Passed - K in normal range and within 180 days    Potassium  Date Value Ref Range Status  10/13/2021 4.2 3.5 - 5.3 mmol/L Final         Passed - Patient is not pregnant      Passed - Last BP in normal range    BP Readings from Last 1 Encounters:  01/11/22 136/84         Passed - Valid encounter within last 6 months    Recent Outpatient Visits           1 week ago Recurrent sinusitis   [facility] [place], Salvadore Oxford, NP   1 month ago Neck pain, acute   [facility] [place], Oswaldo Conroy, [place]   2 months ago Essential hypertension   [facility] [place], Salvadore Oxford, NP   3 months ago Encounter for routine adult medical exam with abnormal findings   [facility] [place], Salvadore Oxford, NP   11 months ago Left flank pain   [facility] [place], Salvadore Oxford, NP       Future Appointments             In 2 months Stoioff, Verna Czech, MD [facility] [place]   In 4 months [place], Salvadore Oxford, NP [facility], [facility]

## 2022-01-20 ENCOUNTER — Other Ambulatory Visit: Payer: Self-pay | Admitting: Podiatry

## 2022-02-10 ENCOUNTER — Encounter: Payer: Self-pay | Admitting: Internal Medicine

## 2022-02-10 ENCOUNTER — Telehealth (INDEPENDENT_AMBULATORY_CARE_PROVIDER_SITE_OTHER): Payer: 59 | Admitting: Internal Medicine

## 2022-02-10 ENCOUNTER — Other Ambulatory Visit: Payer: Self-pay | Admitting: Internal Medicine

## 2022-02-10 DIAGNOSIS — R6889 Other general symptoms and signs: Secondary | ICD-10-CM | POA: Diagnosis not present

## 2022-02-10 MED ORDER — OSELTAMIVIR PHOSPHATE 75 MG PO CAPS: 75.0000 mg | ORAL_CAPSULE | Freq: Two times a day (BID) | ORAL | 0 refills | Status: DC

## 2022-02-10 MED ORDER — PROMETHAZINE-DM 6.25-15 MG/5ML PO SYRP: 5.0000 mL | ORAL_SOLUTION | Freq: Four times a day (QID) | ORAL | 0 refills | Status: DC | PRN

## 2022-02-10 NOTE — Progress Notes (Signed)
Virtual Visit via Video Note  I connected with [name] on 02/10/22 at 11:20 AM EST by a video enabled telemedicine application and verified that I am speaking with the correct person using two identifiers.  Location: Patient: Home Provider: Office  Persons participating in this video call: Nicki Reaper, NP and Sydnee Cabal   I discussed the limitations of evaluation and management by telemedicine and the availability of in person appointments. The patient expressed understanding and agreed to proceed.  History of Present Illness:  Patient reports chills, body aches, fever, headache, cough. This started last night.  The headache is located in his forehead.  He describes the pain as pressure.  The cough is dry nonproductive.  He denies runny nose, nasal congestion, ear pain, sore throat, shortness of breath, chest pain, nausea, vomiting or diarrhea.  He has tried ibuprofen OTC with some relief of symptoms.  He reports he has had sick contacts that he thinks been diagnosed with the flu.   Past Medical History:  Diagnosis Date   Allergic rhinitis    HTN (hypertension)    LBP (low back pain)    Osteoarthritis     Current Outpatient Medications  Medication Sig Dispense Refill   Cholecalciferol (VITAMIN D3) 50 MCG (2000 UT) TABS Take by mouth.     gabapentin (NEURONTIN) 100 MG capsule Take 2 capsules (200 mg total) by mouth 2 (two) times daily. 120 capsule 0   losartan (COZAAR) 50 MG tablet TAKE 1 AND 1/2 TABLETS BY MOUTH DAILY 135 tablet 0   meloxicam (MOBIC) 15 MG tablet TAKE 1 TABLET (15 MG TOTAL) BY MOUTH DAILY. 30 tablet 3   methocarbamol (ROBAXIN) 500 MG tablet Take 1 tablet (500 mg total) by mouth at bedtime as needed for muscle spasms. 15 tablet 0   Multiple Vitamin (MULTIVITAMIN) tablet Take 1 tablet by mouth daily.     predniSONE (STERAPRED UNI-PAK 48 TAB) 10 MG (48) TBPK tablet Take by mouth as directed.     tamsulosin (FLOMAX) 0.4 MG CAPS capsule Take 1 capsule (0.4 mg  total) by mouth daily. 30 capsule 0   No current facility-administered medications for this visit.    No Known Allergies  Family History  Problem Relation Age of Onset   Diabetes Mother    Hypertension Mother    Diabetes Father    Hypertension Father    Hypertension Unknown    Diabetes Unknown     Social History   Socioeconomic History   Marital status: Married    Spouse name: Not on file   Number of children: Not on file   Years of education: Not on file   Highest education level: Not on file  Occupational History   Not on file  Tobacco Use   Smoking status: Never   Smokeless tobacco: Never  Substance and Sexual Activity   Alcohol use: No   Drug use: No   Sexual activity: Yes  Other Topics Concern   Not on file  Social History Narrative   Not on file   Social Determinants of Health   Financial Resource Strain: Not on file  Food Insecurity: Not on file  Transportation Needs: Not on file  Physical Activity: Not on file  Stress: Not on file  Social Connections: Not on file  Intimate Partner Violence: Not on file     Constitutional: Patient reports fever, chills, body aches and headache.  Denies abrupt weight changes.  HEENT: Denies eye pain, eye redness, ear pain, ringing in  the ears, wax buildup, runny nose, nasal congestion, bloody nose, or sore throat. Respiratory: Patient reports cough.  Denies difficulty breathing, shortness of breath, or sputum production.   Cardiovascular: Denies chest pain, chest tightness, palpitations or swelling in the hands or feet.  Gastrointestinal: Denies abdominal pain, bloating, constipation, diarrhea or blood in the stool.   No other specific complaints in a complete review of systems (except as listed in HPI above).  Observations/Objective:   Wt Readings from Last 3 Encounters:  01/11/22 176 lb (79.8 kg)  12/04/21 179 lb 3.2 oz (81.3 kg)  11/05/21 176 lb (79.8 kg)    General: Appears his stated age, in NAD. HEENT:  Head: normal shape and size;  Nose: No congestion noted; Throat/Mouth: Hoarseness noted Pulmonary/Chest: Normal effort. No respiratory distress.  Neurological: Alert and oriented.   BMET    Component Value Date/Time   NA 139 10/13/2021 1537   K 4.2 10/13/2021 1537   CL 105 10/13/2021 1537   CO2 27 10/13/2021 1537   GLUCOSE 89 10/13/2021 1537   BUN 23 10/13/2021 1537   CREATININE 0.95 10/13/2021 1537   CALCIUM 9.7 10/13/2021 1537   GFRNONAA 87.98 07/30/2009 0815   GFRAA 104 07/11/2007 1017    Lipid Panel     Component Value Date/Time   CHOL 185 10/13/2021 1537   TRIG 133 10/13/2021 1537   HDL 48 10/13/2021 1537   CHOLHDL 3.9 10/13/2021 1537   VLDL 16.4 05/20/2020 0839   LDLCALC 112 (H) 10/13/2021 1537    CBC    Component Value Date/Time   WBC 8.8 10/13/2021 1537   RBC 5.45 10/13/2021 1537   HGB 16.5 10/13/2021 1537   HCT 48.6 10/13/2021 1537   PLT 295 10/13/2021 1537   MCV 89.2 10/13/2021 1537   MCH 30.3 10/13/2021 1537   MCHC 34.0 10/13/2021 1537   RDW 12.5 10/13/2021 1537   LYMPHSABS 2.5 01/10/2012 0841   MONOABS 0.5 01/10/2012 0841   EOSABS 0.1 01/10/2012 0841   BASOSABS 0.0 01/10/2012 0841    Hgb A1C Lab Results  Component Value Date   HGBA1C 5.4 10/13/2021       Assessment and Plan:  Fever, Chills, Body Aches, Headache and Cough:  He may have exposure to the flu We will treat with Tamiflu 75 mg twice daily x5 days Rx for Promethazine DM cough syrup-sedation caution given Okay to continue Tylenol or Ibuprofen OTC as needed for fever and headaches Encourage rest and fluids  RTC in 3 months for follow-up of chronic conditions.  Follow Up Instructions:    I discussed the assessment and treatment plan with the patient. The patient was provided an opportunity to ask questions and all were answered. The patient agreed with the plan and demonstrated an understanding of the instructions.   The patient was advised to call back or seek an in-person  evaluation if the symptoms worsen or if the condition fails to improve as anticipated.   Nicki Reaper, NP

## 2022-02-10 NOTE — Telephone Encounter (Signed)
Rx 01/18/22 #135- too soon Requested Prescriptions  Pending Prescriptions Disp Refills   losartan (COZAAR) 50 MG tablet [Pharmacy Med Name: LOSARTAN POTASSIUM 50 MG TAB] 30 tablet     Sig: TAKE 1 TABLET BY MOUTH EVERY DAY     Cardiovascular:  Angiotensin Receptor Blockers Passed - 02/10/2022  1:44 AM      Passed - Cr in normal range and within 180 days    Creat  Date Value Ref Range Status  10/13/2021 0.95 0.70 - 1.30 mg/dL Final         Passed - K in normal range and within 180 days    Potassium  Date Value Ref Range Status  10/13/2021 4.2 3.5 - 5.3 mmol/L Final         Passed - Patient is not pregnant      Passed - Last BP in normal range    BP Readings from Last 1 Encounters:  01/11/22 136/84         Passed - Valid encounter within last 6 months    Recent Outpatient Visits           Today Flu-like symptoms   [facility] [place], Salvadore Oxford, NP   1 month ago Recurrent sinusitis   [facility] [place], Salvadore Oxford, NP   2 months ago Neck pain, acute   [facility] [place], Oswaldo Conroy, [place]   3 months ago Essential hypertension   [facility] [place], Salvadore Oxford, NP   4 months ago Encounter for routine adult medical exam with abnormal findings   [facility], Salvadore Oxford, NP       Future Appointments             In 2 months Stoioff, Verna Czech, MD [facility] [place]   In 3 months [place], Salvadore Oxford, NP [facility], [facility]

## 2022-02-10 NOTE — Patient Instructions (Signed)

## 2022-04-02 ENCOUNTER — Other Ambulatory Visit: Payer: Self-pay | Admitting: *Deleted

## 2022-04-02 DIAGNOSIS — N200 Calculus of kidney: Secondary | ICD-10-CM

## 2022-04-07 ENCOUNTER — Ambulatory Visit: Payer: No Typology Code available for payment source | Admitting: Urology

## 2022-04-12 ENCOUNTER — Ambulatory Visit: Payer: No Typology Code available for payment source | Admitting: Urology

## 2022-04-24 ENCOUNTER — Encounter: Payer: Self-pay | Admitting: Internal Medicine

## 2022-04-24 ENCOUNTER — Other Ambulatory Visit: Payer: Self-pay | Admitting: Internal Medicine

## 2022-04-26 NOTE — Telephone Encounter (Signed)
Requested Prescriptions  Pending Prescriptions Disp Refills   losartan (COZAAR) 50 MG tablet [Pharmacy Med Name: LOSARTAN POTASSIUM 50 MG TAB] 135 tablet 1    Sig: TAKE 1 AND 1/2 TABLETS DAILY BY MOUTH     Cardiovascular:  Angiotensin Receptor Blockers Failed - 04/24/2022  8:39 AM      Failed - Cr in normal range and within 180 days    Creat  Date Value Ref Range Status  10/13/2021 0.95 0.70 - 1.30 mg/dL Final         Failed - K in normal range and within 180 days    Potassium  Date Value Ref Range Status  10/13/2021 4.2 3.5 - 5.3 mmol/L Final         Passed - Patient is not pregnant      Passed - Last BP in normal range    BP Readings from Last 1 Encounters:  01/11/22 136/84         Passed - Valid encounter within last 6 months    Recent Outpatient Visits           2 months ago Flu-like symptoms   [facility] [place], Coralie Keens, NP   3 months ago Recurrent sinusitis   [facility] [place], Coralie Keens, NP   4 months ago Neck pain, acute   [place], [place]   5 months ago Essential hypertension   [facility] [place], Coralie Keens, NP   6 months ago Encounter for routine adult medical exam with abnormal findings   [facility] [place], Coralie Keens, NP       Future Appointments             In 1 month [place], Coralie Keens, NP [facility], [facility]

## 2022-05-17 ENCOUNTER — Ambulatory Visit
Admission: RE | Admit: 2022-05-17 | Discharge: 2022-05-17 | Disposition: A | Discharge status: Home / Self Care | Payer: 59 | Attending: Urology | Admitting: Urology

## 2022-05-17 ENCOUNTER — Ambulatory Visit (INDEPENDENT_AMBULATORY_CARE_PROVIDER_SITE_OTHER): Payer: 59 | Admitting: Physician Assistant

## 2022-05-17 ENCOUNTER — Encounter: Payer: Self-pay | Admitting: Physician Assistant

## 2022-05-17 ENCOUNTER — Ambulatory Visit
Admission: RE | Admit: 2022-05-17 | Discharge: 2022-05-17 | Disposition: A | Discharge status: Home / Self Care | Payer: 59 | Source: Ambulatory Visit | Attending: Urology | Admitting: Urology

## 2022-05-17 ENCOUNTER — Other Ambulatory Visit
Admission: RE | Admit: 2022-05-17 | Discharge: 2022-05-17 | Disposition: A | Discharge status: Home / Self Care | Payer: 59 | Source: Ambulatory Visit | Attending: Physician Assistant | Admitting: Physician Assistant

## 2022-05-17 VITALS — BP 129/85 | HR 76 | Ht 67.0 in | Wt 175.0 lb

## 2022-05-17 DIAGNOSIS — N201 Calculus of ureter: Secondary | ICD-10-CM

## 2022-05-17 DIAGNOSIS — N200 Calculus of kidney: Secondary | ICD-10-CM

## 2022-05-17 LAB — URINALYSIS, COMPLETE
Bilirubin, UA: NEGATIVE
Glucose, UA: NEGATIVE
Ketones, UA: NEGATIVE
Leukocytes,UA: NEGATIVE
Nitrite, UA: NEGATIVE
Specific Gravity, UA: 1.025 (ref 1.005–1.030)
Urobilinogen, Ur: 0.2 mg/dL (ref 0.2–1.0)
pH, UA: 5.5 (ref 5.0–7.5)

## 2022-05-17 LAB — MICROSCOPIC EXAMINATION: RBC, Urine: >30 /hpf — AB (ref 0–2)

## 2022-05-17 LAB — BASIC METABOLIC PANEL
Anion gap: 8 (ref 5–15)
BUN: 18 mg/dL (ref 6–20)
CO2: 24 mmol/L (ref 22–32)
Calcium: 9.3 mg/dL (ref 8.9–10.3)
Chloride: 107 mmol/L (ref 98–111)
Creatinine, Ser: 0.84 mg/dL (ref 0.61–1.24)
GFR, Estimated: >60 mL/min (ref >60)
Glucose, Bld: 87 mg/dL (ref 70–99)
Potassium: 3.9 mmol/L (ref 3.5–5.1)
Sodium: 139 mmol/L (ref 135–145)

## 2022-05-17 NOTE — Patient Instructions (Signed)
We will call you with your blood work results this afternoon. Please plan to not eat or drink anything after midnight tonight (sips of plain water with medications are okay).

## 2022-05-17 NOTE — H&P (View-Only) (Signed)
05/17/2022 4:07 PM   Jeremy Johns 06-04-1960 [id number]  CC: Chief Complaint  Patient presents with   Follow-up   HPI: Jeremy Johns is a 62 y.o. male with PMH nephrolithiasis who presents today for evaluation of a possible acute stone episode.   Today he reports sudden onset left flank and LLQ pain associated with gross hematuria starting 3 days ago.  His pain is currently minimal, rated 1-2/10 in severity.  He denies fever, chills, nausea, or vomiting.  On further discussion, he reports that he had a similar episode of acute onset right flank pain > 1 month ago.  His symptoms completely resolved but he never saw a stone pass.  He continues to urinate and has not noticed a decrease in his overall urinary output.  KUB today with a 9 mm proximal left ureteral stone and 5 mm proximal right ureteral stone.  In-office UA today positive for 3+ blood and trace protein; urine microscopy with >30 RBCs/HPF and granular casts.   PMH: Past Medical History:  Diagnosis Date   Allergic rhinitis    HTN (hypertension)    LBP (low back pain)    Osteoarthritis     Surgical History: Past Surgical History:  Procedure Laterality Date   EYE SURGERY Bilateral 2012   R cataract    Home Medications:  Allergies as of 05/17/2022   No Known Allergies      Medication List        Accurate as of May 17, 2022  4:07 PM. If you have any questions, ask your nurse or doctor.          STOP taking these medications    oseltamivir 75 MG capsule Commonly known as: TAMIFLU Stopped by: Debroah Loop, PA-C   predniSONE 10 MG (48) Tbpk tablet Commonly known as: STERAPRED UNI-PAK 48 TAB Stopped by: Debroah Loop, PA-C       TAKE these medications    gabapentin 100 MG capsule Commonly known as: Neurontin Take 2 capsules (200 mg total) by mouth 2 (two) times daily.   losartan 50 MG tablet Commonly known as: COZAAR TAKE 1 AND 1/2 TABLETS DAILY BY MOUTH   meloxicam  15 MG tablet Commonly known as: MOBIC TAKE 1 TABLET (15 MG TOTAL) BY MOUTH DAILY.   methocarbamol 500 MG tablet Commonly known as: ROBAXIN Take 1 tablet (500 mg total) by mouth at bedtime as needed for muscle spasms.   multivitamin tablet Take 1 tablet by mouth daily.   promethazine-dextromethorphan 6.25-15 MG/5ML syrup Commonly known as: PROMETHAZINE-DM Take 5 mLs by mouth 4 (four) times daily as needed.   tamsulosin 0.4 MG Caps capsule Commonly known as: FLOMAX Take 1 capsule (0.4 mg total) by mouth daily.   Vitamin D3 50 MCG (2000 UT) Tabs Generic drug: Cholecalciferol Take by mouth.        Allergies:  No Known Allergies  Family History: Family History  Problem Relation Age of Onset   Diabetes Mother    Hypertension Mother    Diabetes Father    Hypertension Father    Hypertension Unknown    Diabetes Unknown     Social History:   reports that he has never smoked. He has never used smokeless tobacco. He reports that he does not drink alcohol and does not use drugs.  Physical Exam: BP 129/85   Pulse 76   Ht '5\' 7"'$  (1.702 m)   Wt 175 lb (79.4 kg)   BMI 27.41 kg/m   Constitutional:  Alert  and oriented, no acute distress, nontoxic appearing HEENT: [place], AT Cardiovascular: No clubbing, cyanosis, or edema Respiratory: Normal respiratory effort, no increased work of breathing Skin: No rashes, bruises or suspicious lesions Neurologic: Grossly intact, no focal deficits, moving all 4 extremities Psychiatric: Normal mood and affect  Laboratory Data: Results for orders placed or performed in visit on 05/17/22  Microscopic Examination   Urine  Result Value Ref Range   WBC, UA 0-5 0 - 5 /hpf   RBC, Urine >30 (A) 0 - 2 /hpf   Epithelial Cells (non renal) 0-10 0 - 10 /hpf   Casts Present (A) None seen /lpf   Cast Type Granular casts (A) N/A   Bacteria, UA Few None seen/Few  Urinalysis, Complete  Result Value Ref Range   Specific Gravity, UA 1.025 1.005 - 1.030    pH, UA 5.5 5.0 - 7.5   Color, UA Yellow Yellow   Appearance Ur Hazy (A) Clear   Leukocytes,UA Negative Negative   Protein,UA Trace (A) Negative/Trace   Glucose, UA Negative Negative   Ketones, UA Negative Negative   RBC, UA 3+ (A) Negative   Bilirubin, UA Negative Negative   Urobilinogen, Ur 0.2 0.2 - 1.0 mg/dL   Nitrite, UA Negative Negative   Microscopic Examination See below:    Pertinent Imaging: KUB, 05/17/2022: See Epic.  I personally reviewed the images referenced above and note a 66m proximal left ureteral stone and 559mproximal right ureteral stone.  Assessment & Plan:   1. Nephrolithiasis Left renal colic associated with bilateral proximal ureteral stones.  We discussed that he is not a candidate for ESWL due to the inability to treat more than 1 stone per procedure and that bilateral ureteral stones require urgent intervention. Will obtain STAT BMP at the hospital lab and contact patient with results. If evidence of acute renal failure, will direct to the ED for admission with bilateral ureteral stent placement overnight. If renal function is stable, will likely add on for bilateral ureteroscopy tomorrow. Counseled patient to be NPO at midnight tonight and await further instruction. He expressed understanding. - Urinalysis, Complete - Basic metabolic panel; Future - CULTURE, URINE COMPREHENSIVE   Return for Will call with results.  SaDebroah LoopPA-C  [facility] [address] StreetSuRameruBlue HillNC [id number] [phone]

## 2022-05-17 NOTE — H&P (View-Only) (Signed)
05/17/2022 4:07 PM   [name] April 30, 1960 [id number]  CC: Chief Complaint  Patient presents with   Follow-up   HPI: Jeremy Johns is a 62 y.o. male with PMH nephrolithiasis who presents today for evaluation of a possible acute stone episode.   Today he reports sudden onset left flank and LLQ pain associated with gross hematuria starting 3 days ago.  His pain is currently minimal, rated 1-2/10 in severity.  He denies fever, chills, nausea, or vomiting.  On further discussion, he reports that he had a similar episode of acute onset right flank pain > 1 month ago.  His symptoms completely resolved but he never saw a stone pass.  He continues to urinate and has not noticed a decrease in his overall urinary output.  KUB today with a 9 mm proximal left ureteral stone and 5 mm proximal right ureteral stone.  In-office UA today positive for 3+ blood and trace protein; urine microscopy with >30 RBCs/HPF and granular casts.   PMH: Past Medical History:  Diagnosis Date   Allergic rhinitis    HTN (hypertension)    LBP (low back pain)    Osteoarthritis     Surgical History: Past Surgical History:  Procedure Laterality Date   EYE SURGERY Bilateral 2012   R cataract    Home Medications:  Allergies as of 05/17/2022   No Known Allergies      Medication List        Accurate as of May 17, 2022  4:07 PM. If you have any questions, ask your nurse or doctor.          STOP taking these medications    oseltamivir 75 MG capsule Commonly known as: TAMIFLU Stopped by: Debroah Loop, PA-C   predniSONE 10 MG (48) Tbpk tablet Commonly known as: STERAPRED UNI-PAK 48 TAB Stopped by: Debroah Loop, PA-C       TAKE these medications    gabapentin 100 MG capsule Commonly known as: Neurontin Take 2 capsules (200 mg total) by mouth 2 (two) times daily.   losartan 50 MG tablet Commonly known as: COZAAR TAKE 1 AND 1/2 TABLETS DAILY BY MOUTH   meloxicam  15 MG tablet Commonly known as: MOBIC TAKE 1 TABLET (15 MG TOTAL) BY MOUTH DAILY.   methocarbamol 500 MG tablet Commonly known as: ROBAXIN Take 1 tablet (500 mg total) by mouth at bedtime as needed for muscle spasms.   multivitamin tablet Take 1 tablet by mouth daily.   promethazine-dextromethorphan 6.25-15 MG/5ML syrup Commonly known as: PROMETHAZINE-DM Take 5 mLs by mouth 4 (four) times daily as needed.   tamsulosin 0.4 MG Caps capsule Commonly known as: FLOMAX Take 1 capsule (0.4 mg total) by mouth daily.   Vitamin D3 50 MCG (2000 UT) Tabs Generic drug: Cholecalciferol Take by mouth.        Allergies:  No Known Allergies  Family History: Family History  Problem Relation Age of Onset   Diabetes Mother    Hypertension Mother    Diabetes Father    Hypertension Father    Hypertension Unknown    Diabetes Unknown     Social History:   reports that he has never smoked. He has never used smokeless tobacco. He reports that he does not drink alcohol and does not use drugs.  Physical Exam: BP 129/85   Pulse 76   Ht '5\' 7"'$  (1.702 m)   Wt 175 lb (79.4 kg)   BMI 27.41 kg/m   Constitutional:  Alert  and oriented, no acute distress, nontoxic appearing HEENT: [place], AT Cardiovascular: No clubbing, cyanosis, or edema Respiratory: Normal respiratory effort, no increased work of breathing Skin: No rashes, bruises or suspicious lesions Neurologic: Grossly intact, no focal deficits, moving all 4 extremities Psychiatric: Normal mood and affect  Laboratory Data: Results for orders placed or performed in visit on 05/17/22  Microscopic Examination   Urine  Result Value Ref Range   WBC, UA 0-5 0 - 5 /hpf   RBC, Urine >30 (A) 0 - 2 /hpf   Epithelial Cells (non renal) 0-10 0 - 10 /hpf   Casts Present (A) None seen /lpf   Cast Type Granular casts (A) N/A   Bacteria, UA Few None seen/Few  Urinalysis, Complete  Result Value Ref Range   Specific Gravity, UA 1.025 1.005 - 1.030    pH, UA 5.5 5.0 - 7.5   Color, UA Yellow Yellow   Appearance Ur Hazy (A) Clear   Leukocytes,UA Negative Negative   Protein,UA Trace (A) Negative/Trace   Glucose, UA Negative Negative   Ketones, UA Negative Negative   RBC, UA 3+ (A) Negative   Bilirubin, UA Negative Negative   Urobilinogen, Ur 0.2 0.2 - 1.0 mg/dL   Nitrite, UA Negative Negative   Microscopic Examination See below:    Pertinent Imaging: KUB, 05/17/2022: See Epic.  I personally reviewed the images referenced above and note a 32m proximal left ureteral stone and 519mproximal right ureteral stone.  Assessment & Plan:   1. Nephrolithiasis Left renal colic associated with bilateral proximal ureteral stones.  We discussed that he is not a candidate for ESWL due to the inability to treat more than 1 stone per procedure and that bilateral ureteral stones require urgent intervention. Will obtain STAT BMP at the hospital lab and contact patient with results. If evidence of acute renal failure, will direct to the ED for admission with bilateral ureteral stent placement overnight. If renal function is stable, will likely add on for bilateral ureteroscopy tomorrow. Counseled patient to be NPO at midnight tonight and await further instruction. He expressed understanding. - Urinalysis, Complete - Basic metabolic panel; Future - CULTURE, URINE COMPREHENSIVE   Return for Will call with results.  SaDebroah LoopPA-C  [facility] [address]SuMadisonuNew HavenNC [id number] [phone]

## 2022-05-17 NOTE — Progress Notes (Signed)
05/17/2022 4:07 PM   Jeremy Johns 1960/11/13 [id number]  CC: Chief Complaint  Patient presents with   Follow-up   HPI: Jeremy Johns is a 62 y.o. male with PMH nephrolithiasis who presents today for evaluation of a possible acute stone episode.   Today he reports sudden onset left flank and LLQ pain associated with gross hematuria starting 3 days ago.  His pain is currently minimal, rated 1-2/10 in severity.  He denies fever, chills, nausea, or vomiting.  On further discussion, he reports that he had a similar episode of acute onset right flank pain > 1 month ago.  His symptoms completely resolved but he never saw a stone pass.  He continues to urinate and has not noticed a decrease in his overall urinary output.  KUB today with a 9 mm proximal left ureteral stone and 5 mm proximal right ureteral stone.  In-office UA today positive for 3+ blood and trace protein; urine microscopy with >30 RBCs/HPF and granular casts.   PMH: Past Medical History:  Diagnosis Date   Allergic rhinitis    HTN (hypertension)    LBP (low back pain)    Osteoarthritis     Surgical History: Past Surgical History:  Procedure Laterality Date   EYE SURGERY Bilateral 2012   R cataract    Home Medications:  Allergies as of 05/17/2022   No Known Allergies      Medication List        Accurate as of May 17, 2022  4:07 PM. If you have any questions, ask your nurse or doctor.          STOP taking these medications    oseltamivir 75 MG capsule Commonly known as: TAMIFLU Stopped by: Debroah Loop, PA-C   predniSONE 10 MG (48) Tbpk tablet Commonly known as: STERAPRED UNI-PAK 48 TAB Stopped by: Debroah Loop, PA-C       TAKE these medications    gabapentin 100 MG capsule Commonly known as: Neurontin Take 2 capsules (200 mg total) by mouth 2 (two) times daily.   losartan 50 MG tablet Commonly known as: COZAAR TAKE 1 AND 1/2 TABLETS DAILY BY MOUTH   meloxicam  15 MG tablet Commonly known as: MOBIC TAKE 1 TABLET (15 MG TOTAL) BY MOUTH DAILY.   methocarbamol 500 MG tablet Commonly known as: ROBAXIN Take 1 tablet (500 mg total) by mouth at bedtime as needed for muscle spasms.   multivitamin tablet Take 1 tablet by mouth daily.   promethazine-dextromethorphan 6.25-15 MG/5ML syrup Commonly known as: PROMETHAZINE-DM Take 5 mLs by mouth 4 (four) times daily as needed.   tamsulosin 0.4 MG Caps capsule Commonly known as: FLOMAX Take 1 capsule (0.4 mg total) by mouth daily.   Vitamin D3 50 MCG (2000 UT) Tabs Generic drug: Cholecalciferol Take by mouth.        Allergies:  No Known Allergies  Family History: Family History  Problem Relation Age of Onset   Diabetes Mother    Hypertension Mother    Diabetes Father    Hypertension Father    Hypertension Unknown    Diabetes Unknown     Social History:   reports that he has never smoked. He has never used smokeless tobacco. He reports that he does not drink alcohol and does not use drugs.  Physical Exam: BP 129/85   Pulse 76   Ht '5\' 7"'$  (1.702 m)   Wt 175 lb (79.4 kg)   BMI 27.41 kg/m   Constitutional:  Alert  and oriented, no acute distress, nontoxic appearing HEENT: [place], AT Cardiovascular: No clubbing, cyanosis, or edema Respiratory: Normal respiratory effort, no increased work of breathing Skin: No rashes, bruises or suspicious lesions Neurologic: Grossly intact, no focal deficits, moving all 4 extremities Psychiatric: Normal mood and affect  Laboratory Data: Results for orders placed or performed in visit on 05/17/22  Microscopic Examination   Urine  Result Value Ref Range   WBC, UA 0-5 0 - 5 /hpf   RBC, Urine >30 (A) 0 - 2 /hpf   Epithelial Cells (non renal) 0-10 0 - 10 /hpf   Casts Present (A) None seen /lpf   Cast Type Granular casts (A) N/A   Bacteria, UA Few None seen/Few  Urinalysis, Complete  Result Value Ref Range   Specific Gravity, UA 1.025 1.005 - 1.030    pH, UA 5.5 5.0 - 7.5   Color, UA Yellow Yellow   Appearance Ur Hazy (A) Clear   Leukocytes,UA Negative Negative   Protein,UA Trace (A) Negative/Trace   Glucose, UA Negative Negative   Ketones, UA Negative Negative   RBC, UA 3+ (A) Negative   Bilirubin, UA Negative Negative   Urobilinogen, Ur 0.2 0.2 - 1.0 mg/dL   Nitrite, UA Negative Negative   Microscopic Examination See below:    Pertinent Imaging: KUB, 05/17/2022: See Epic.  I personally reviewed the images referenced above and note a 46m proximal left ureteral stone and 5107mproximal right ureteral stone.  Assessment & Plan:   1. Nephrolithiasis Left renal colic associated with bilateral proximal ureteral stones.  We discussed that he is not a candidate for ESWL due to the inability to treat more than 1 stone per procedure and that bilateral ureteral stones require urgent intervention. Will obtain STAT BMP at the hospital lab and contact patient with results. If evidence of acute renal failure, will direct to the ED for admission with bilateral ureteral stent placement overnight. If renal function is stable, will likely add on for bilateral ureteroscopy tomorrow. Counseled patient to be NPO at midnight tonight and await further instruction. He expressed understanding. - Urinalysis, Complete - Basic metabolic panel; Future - CULTURE, URINE COMPREHENSIVE   Return for Will call with results.  SaDebroah LoopPA-C  [facility] [address] LaneSuGastonuKearny[place] [phone]

## 2022-05-18 ENCOUNTER — Encounter
Admission: RE | Disposition: A | Discharge status: Home / Self Care | Payer: Self-pay | Source: Home / Self Care | Attending: Urology

## 2022-05-18 ENCOUNTER — Ambulatory Visit: Payer: 59

## 2022-05-18 ENCOUNTER — Ambulatory Visit: Payer: 59 | Admitting: Certified Registered Nurse Anesthetist

## 2022-05-18 ENCOUNTER — Other Ambulatory Visit: Payer: Self-pay | Admitting: Physician Assistant

## 2022-05-18 ENCOUNTER — Telehealth: Payer: Self-pay

## 2022-05-18 ENCOUNTER — Other Ambulatory Visit: Payer: Self-pay

## 2022-05-18 ENCOUNTER — Encounter: Payer: Self-pay | Admitting: Urology

## 2022-05-18 ENCOUNTER — Ambulatory Visit
Admission: RE | Admit: 2022-05-18 | Discharge: 2022-05-18 | Disposition: A | Discharge status: Home / Self Care | Payer: 59 | Attending: Urology | Admitting: Urology

## 2022-05-18 DIAGNOSIS — R310 Gross hematuria: Secondary | ICD-10-CM | POA: Diagnosis not present

## 2022-05-18 DIAGNOSIS — I10 Essential (primary) hypertension: Secondary | ICD-10-CM | POA: Diagnosis not present

## 2022-05-18 DIAGNOSIS — N202 Calculus of kidney with calculus of ureter: Secondary | ICD-10-CM | POA: Diagnosis present

## 2022-05-18 DIAGNOSIS — N200 Calculus of kidney: Secondary | ICD-10-CM

## 2022-05-18 DIAGNOSIS — N201 Calculus of ureter: Secondary | ICD-10-CM

## 2022-05-18 HISTORY — PX: CYSTOSCOPY WITH STENT PLACEMENT: SHX5790

## 2022-05-18 HISTORY — PX: CYSTOSCOPY/URETEROSCOPY/HOLMIUM LASER/STENT PLACEMENT: SHX6546

## 2022-05-18 SURGERY — CYSTOSCOPY/URETEROSCOPY/HOLMIUM LASER/STENT PLACEMENT
Anesthesia: General | Laterality: Right

## 2022-05-18 MED ORDER — OXYBUTYNIN CHLORIDE 5 MG PO TABS: ORAL_TABLET | ORAL | 0 refills | Status: DC

## 2022-05-18 MED ORDER — EPHEDRINE SULFATE (PRESSORS) 50 MG/ML IJ SOLN
INTRAMUSCULAR | Status: DC | PRN
  Administered 2022-05-18 (×4): 5 mg via INTRAVENOUS

## 2022-05-18 MED ORDER — SODIUM CHLORIDE 0.9 % IR SOLN
Status: DC | PRN
  Administered 2022-05-18: 3000 mL

## 2022-05-18 MED ORDER — DROPERIDOL 2.5 MG/ML IJ SOLN: 0.6250 mg | Freq: Once | INTRAMUSCULAR | Status: DC | PRN

## 2022-05-18 MED ORDER — ACETAMINOPHEN 10 MG/ML IV SOLN: 1000.0000 mg | Freq: Once | INTRAVENOUS | Status: DC | PRN

## 2022-05-18 MED ORDER — IOHEXOL 180 MG/ML  SOLN
INTRAMUSCULAR | Status: DC | PRN
  Administered 2022-05-18: 20 mL

## 2022-05-18 MED ORDER — OXYBUTYNIN CHLORIDE 5 MG PO TABS
ORAL_TABLET | ORAL | Status: AC
  Administered 2022-05-18: 5 mg via ORAL
  Filled 2022-05-18: qty 1

## 2022-05-18 MED ORDER — DEXAMETHASONE SODIUM PHOSPHATE 10 MG/ML IJ SOLN
INTRAMUSCULAR | Status: AC
  Filled 2022-05-18: qty 3

## 2022-05-18 MED ORDER — CHLORHEXIDINE GLUCONATE 0.12 % MT SOLN: 15.0000 mL | Freq: Once | OROMUCOSAL | Status: AC

## 2022-05-18 MED ORDER — FENTANYL CITRATE (PF) 100 MCG/2ML IJ SOLN
INTRAMUSCULAR | Status: AC
  Filled 2022-05-18: qty 2

## 2022-05-18 MED ORDER — PHENYLEPHRINE 80 MCG/ML (10ML) SYRINGE FOR IV PUSH (FOR BLOOD PRESSURE SUPPORT)
PREFILLED_SYRINGE | INTRAVENOUS | Status: DC | PRN
  Administered 2022-05-18: 80 ug via INTRAVENOUS
  Administered 2022-05-18: 160 ug via INTRAVENOUS
  Administered 2022-05-18 (×2): 80 ug via INTRAVENOUS
  Administered 2022-05-18: 160 ug via INTRAVENOUS
  Administered 2022-05-18: 80 ug via INTRAVENOUS

## 2022-05-18 MED ORDER — DEXAMETHASONE SODIUM PHOSPHATE 10 MG/ML IJ SOLN
INTRAMUSCULAR | Status: DC | PRN
  Administered 2022-05-18: 10 mg via INTRAVENOUS

## 2022-05-18 MED ORDER — MIDAZOLAM HCL 2 MG/2ML IJ SOLN
INTRAMUSCULAR | Status: DC | PRN
  Administered 2022-05-18: 2 mg via INTRAVENOUS

## 2022-05-18 MED ORDER — CHLORHEXIDINE GLUCONATE 0.12 % MT SOLN
OROMUCOSAL | Status: AC
  Administered 2022-05-18: 15 mL via OROMUCOSAL
  Filled 2022-05-18: qty 15

## 2022-05-18 MED ORDER — LIDOCAINE HCL (CARDIAC) PF 100 MG/5ML IV SOSY
PREFILLED_SYRINGE | INTRAVENOUS | Status: DC | PRN
  Administered 2022-05-18: 100 mg via INTRAVENOUS

## 2022-05-18 MED ORDER — GLYCOPYRROLATE 0.2 MG/ML IJ SOLN
INTRAMUSCULAR | Status: AC
  Filled 2022-05-18: qty 3

## 2022-05-18 MED ORDER — OXYCODONE HCL 5 MG PO TABS
ORAL_TABLET | ORAL | Status: AC
  Filled 2022-05-18: qty 1

## 2022-05-18 MED ORDER — CEFAZOLIN SODIUM-DEXTROSE 2-4 GM/100ML-% IV SOLN
2.0000 g | INTRAVENOUS | Status: AC
  Administered 2022-05-18: 2 g via INTRAVENOUS

## 2022-05-18 MED ORDER — PROPOFOL 10 MG/ML IV BOLUS
INTRAVENOUS | Status: DC | PRN
  Administered 2022-05-18: 150 mg via INTRAVENOUS

## 2022-05-18 MED ORDER — ACETAMINOPHEN 10 MG/ML IV SOLN
INTRAVENOUS | Status: DC | PRN
  Administered 2022-05-18: 1000 mg via INTRAVENOUS

## 2022-05-18 MED ORDER — OXYBUTYNIN CHLORIDE 5 MG PO TABS: 5.0000 mg | ORAL_TABLET | Freq: Once | ORAL | Status: AC

## 2022-05-18 MED ORDER — ONDANSETRON HCL 4 MG/2ML IJ SOLN
INTRAMUSCULAR | Status: AC
  Filled 2022-05-18: qty 6

## 2022-05-18 MED ORDER — OXYCODONE HCL 5 MG/5ML PO SOLN: 5.0000 mg | Freq: Once | ORAL | Status: DC | PRN

## 2022-05-18 MED ORDER — CEFAZOLIN SODIUM-DEXTROSE 2-4 GM/100ML-% IV SOLN
INTRAVENOUS | Status: AC
  Filled 2022-05-18: qty 100

## 2022-05-18 MED ORDER — ORAL CARE MOUTH RINSE: 15.0000 mL | Freq: Once | OROMUCOSAL | Status: AC

## 2022-05-18 MED ORDER — LIDOCAINE HCL (PF) 2 % IJ SOLN
INTRAMUSCULAR | Status: AC
  Filled 2022-05-18: qty 5

## 2022-05-18 MED ORDER — LACTATED RINGERS IV SOLN: INTRAVENOUS | Status: DC

## 2022-05-18 MED ORDER — FENTANYL CITRATE (PF) 100 MCG/2ML IJ SOLN
25.0000 ug | INTRAMUSCULAR | Status: DC | PRN
  Administered 2022-05-18: 25 ug via INTRAVENOUS

## 2022-05-18 MED ORDER — OXYCODONE HCL 5 MG PO TABS: 5.0000 mg | ORAL_TABLET | Freq: Once | ORAL | Status: DC | PRN

## 2022-05-18 MED ORDER — MIDAZOLAM HCL 2 MG/2ML IJ SOLN
INTRAMUSCULAR | Status: AC
  Filled 2022-05-18: qty 2

## 2022-05-18 MED ORDER — FENTANYL CITRATE (PF) 100 MCG/2ML IJ SOLN
INTRAMUSCULAR | Status: DC | PRN
  Administered 2022-05-18 (×4): 25 ug via INTRAVENOUS

## 2022-05-18 MED ORDER — EPHEDRINE 5 MG/ML INJ
INTRAVENOUS | Status: AC
  Filled 2022-05-18: qty 15

## 2022-05-18 MED ORDER — PROMETHAZINE HCL 25 MG/ML IJ SOLN: 6.2500 mg | INTRAMUSCULAR | Status: DC | PRN

## 2022-05-18 MED ORDER — OXYCODONE HCL 5 MG PO TABS
5.0000 mg | ORAL_TABLET | Freq: Once | ORAL | Status: AC
  Administered 2022-05-18: 5 mg via ORAL

## 2022-05-18 MED ORDER — HYDROCODONE-ACETAMINOPHEN 5-325 MG PO TABS: 1.0000 | ORAL_TABLET | ORAL | 0 refills | Status: DC | PRN

## 2022-05-18 MED ORDER — PHENYLEPHRINE 80 MCG/ML (10ML) SYRINGE FOR IV PUSH (FOR BLOOD PRESSURE SUPPORT)
PREFILLED_SYRINGE | INTRAVENOUS | Status: AC
  Filled 2022-05-18: qty 30

## 2022-05-18 MED ORDER — ACETAMINOPHEN 10 MG/ML IV SOLN
INTRAVENOUS | Status: AC
  Filled 2022-05-18: qty 100

## 2022-05-18 SURGICAL SUPPLY — 36 items
BAG DRAIN SIEMENS DORNER NS (MISCELLANEOUS) ×3 IMPLANT
BAG DRN NS LF (MISCELLANEOUS) ×2
BASKET ZERO TIP 1.9FR (BASKET) IMPLANT
BRUSH SCRUB EZ 1% IODOPHOR (MISCELLANEOUS) ×3 IMPLANT
BSKT STON RTRVL ZERO TP 1.9FR (BASKET)
CATH URET FLEX-TIP 2 LUMEN 10F (CATHETERS) IMPLANT
CATH URETL OPEN 5X70 (CATHETERS) IMPLANT
CATH URETL OPEN END 6X70 (CATHETERS) IMPLANT
CNTNR URN SCR LID CUP LEK RST (MISCELLANEOUS) IMPLANT
CONT SPEC 4OZ STRL OR WHT (MISCELLANEOUS)
DRAPE UTILITY 15X26 TOWEL STRL (DRAPES) ×3 IMPLANT
FIBER LASER MOSES 200 DFL (Laser) IMPLANT
GAUZE 4X4 16PLY ~~LOC~~+RFID DBL (SPONGE) ×6 IMPLANT
GLOVE SURG UNDER POLY LF SZ7.5 (GLOVE) ×3 IMPLANT
GOWN STRL REUS W/ TWL LRG LVL3 (GOWN DISPOSABLE) ×3 IMPLANT
GOWN STRL REUS W/ TWL XL LVL3 (GOWN DISPOSABLE) ×3 IMPLANT
GOWN STRL REUS W/TWL LRG LVL3 (GOWN DISPOSABLE) ×2
GOWN STRL REUS W/TWL XL LVL3 (GOWN DISPOSABLE) ×2
GUIDEWIRE GREEN .038 145CM (MISCELLANEOUS) IMPLANT
GUIDEWIRE STR DUAL SENSOR (WIRE) ×3 IMPLANT
IV NS IRRIG 3000ML ARTHROMATIC (IV SOLUTION) ×3 IMPLANT
KIT TURNOVER CYSTO (KITS) ×3 IMPLANT
MANIFOLD NEPTUNE II (INSTRUMENTS) ×3 IMPLANT
PACK CYSTO AR (MISCELLANEOUS) ×3 IMPLANT
SCOPE LITHOVUE DISP (UROLOGICAL SUPPLIES) IMPLANT
SCOPE LITHOVUE DISPOSABLE (UROLOGICAL SUPPLIES) ×2
SET CYSTO W/LG BORE CLAMP LF (SET/KITS/TRAYS/PACK) ×3 IMPLANT
SET URETL DIL (MISCELLANEOUS) IMPLANT
SHEATH NAVIGATOR HD 12/14X36 (SHEATH) IMPLANT
STENT URET 6FRX24 CONTOUR (STENTS) IMPLANT
STENT URET 6FRX26 CONTOUR (STENTS) IMPLANT
SURGILUBE 2OZ TUBE FLIPTOP (MISCELLANEOUS) ×3 IMPLANT
TRAP FLUID SMOKE EVACUATOR (MISCELLANEOUS) ×3 IMPLANT
VALVE UROSEAL ADJ ENDO (VALVE) IMPLANT
WATER STERILE IRR 1000ML POUR (IV SOLUTION) ×3 IMPLANT
WATER STERILE IRR 500ML POUR (IV SOLUTION) ×3 IMPLANT

## 2022-05-18 NOTE — Discharge Instructions (Addendum)
AMBULATORY SURGERY  DISCHARGE INSTRUCTIONS   The drugs that you were given will stay in your system until tomorrow so for the next 24 hours you should not:  Drive an automobile Make any legal decisions Drink any alcoholic beverage   You may resume regular meals tomorrow.  Today it is better to start with liquids and gradually work up to solid foods.  You may eat anything you prefer, but it is better to start with liquids, then soup and crackers, and gradually work up to solid foods.   Please notify your doctor immediately if you have any unusual bleeding, trouble breathing, redness and pain at the surgery site, drainage, fever, or pain not relieved by medication.    Your post-operative visit with Dr.                                       is: Date:                        Time:    Please call to schedule your post-operative visit.  Additional Instructions:  DISCHARGE INSTRUCTIONS FOR KIDNEY STONE/URETERAL STENT   MEDICATIONS:  1. Resume all your other meds from home.  2.  AZO (over-the-counter) can help with the burning/stinging when you urinate. 3.  Hydrocodone is for moderate/severe pain, Rx was sent to your pharmacy. 4.  Continue tamsulosin as it may help with stent/bladder irritation.  Rx oxybutynin was also sent to your pharmacy which will help with stent/bladder irritation  ACTIVITY:  1. May resume regular activities in 24 hours. 2. No driving while on narcotic pain medications  3. Drink plenty of water  4. Continue to walk at home - you can still get blood clots when you are at home, so keep active, but don't over do it.  5. May return to work/school tomorrow or when you feel ready    SIGNS/SYMPTOMS TO CALL:  Common postoperative symptoms include urinary frequency, urgency, bladder spasm and blood in the urine  Please call us if you have a fever greater than 101.5, uncontrolled nausea/vomiting, uncontrolled pain, dizziness, unable to urinate, excessively bloody  urine, chest pain, shortness of breath, leg swelling, leg pain, or any other concerns or questions.   You can reach [place] at [phone].   FOLLOW-UP:  1. You will be contacted regarding scheduling shockwave lithotripsy 05/20/2022 for your right-sided stone.  Only take Tylenol or hydrocodone for pain.  Do not take ibuprofen, Aleve, aspirin.  If in doubt please contact office before taking.

## 2022-05-18 NOTE — Transfer of Care (Signed)
Immediate Anesthesia Transfer of Care Note  Patient: Jeremy Johns  Procedure(s) Performed: CYSTOSCOPY/URETEROSCOPY/HOLMIUM LASER/STENT PLACEMENT (Left) CYSTOSCOPY WITH STENT PLACEMENT' DIAGNOSTIC URETEROSCOPY (Right)  Patient Location: PACU  Anesthesia Type:General  Level of Consciousness: drowsy  Airway & Oxygen Therapy: Patient Spontanous Breathing and Patient connected to face mask oxygen  Post-op Assessment: Report given to RN and Post -op Vital signs reviewed and stable  Post vital signs: Reviewed and stable  Last Vitals:  Vitals Value Taken Time  BP 131/71   Temp    Pulse 72   Resp 19   SpO2 100     Last Pain:  Vitals:   05/18/22 1341  TempSrc: Tympanic  PainSc: 0-No pain         Complications: No notable events documented.

## 2022-05-18 NOTE — Interval H&P Note (Signed)
History and Physical Interval Note:  Patient with bilateral ureteral calculi.  He was initially scheduled for right ureteroscopy and left ureteral stent placement however there is available OR time for bilateral ureteroscopy and he would prefer to have the stones treated in 1 procedure.  We did discuss in the small percentage of cases the upper ureter cannot be accessed with the ureteroscope due to anatomy and in that event stent(s) will be placed and he will require either follow-up ureteroscopy or lithotripsy.  We discussed potential risk including bleeding, infection, ureteral injury.  The possibility of stent pain was also discussed.  All questions were answered and he desires to proceed  CV: RRR Lungs: Clear  05/18/2022 2:23 PM  Jeremy Johns  has presented today for surgery, with the diagnosis of Bilateral Ureteral Stones.  The various methods of treatment have been discussed with the patient and family. After consideration of risks, benefits and other options for treatment, the patient has consented to  Procedure(s): CYSTOSCOPY/URETEROSCOPY/HOLMIUM LASER/STENT PLACEMENT (Right) CYSTOSCOPY WITH STENT PLACEMENT (Left) as a surgical intervention.  The patient's history has been reviewed, patient examined, no change in status, stable for surgery.  I have reviewed the patient's chart and labs.  Questions were answered to the patient's satisfaction.     [place]

## 2022-05-18 NOTE — Op Note (Signed)
Preoperative diagnosis:  Bilateral ureteral calculi  Postoperative diagnosis:  Same  Procedure:  Cystoscopy Left ureteroscopy and stone removal Ureteroscopic laser lithotripsy Right ureteroscopy Bilateral ureteral stent placement (52F/24 cm)  Bilateral retrograde pyelography with interpretation Intraoperative fluoroscopy < 30 min  Surgeon: Nicki Reaper C. Brittian Renaldo, M.D.  Anesthesia: General  Complications: None  Intraoperative findings: Cystoscopy-urethra normal in caliber without strictures; mild-moderate lateral lobe enlargement prostate; bladder mucosa without erythema, solid or papillary lesions; UOs normal-appearing bilaterally; mild trabeculation Left ureteropyeloscopy-ureter tight to the flexible ureteroscope.  No definite stricture or mucosal abnormalities.  Calculus identified in an upper pole calyx.  Examination of all calyces after retrograde pyelogram showed no additional calculi; scattered Randall's plaques Right ureteroscopy-tight ureter to a 4.5 French semirigid ureteroscope, advanced to the midportion of the proximal ureter; only able to advance flexible ureteroscope to the distal ureter. Left retrograde pyelography post procedure showed no filling defects, stone fragments or contrast extravasation Right retrograde pyelography demonstrated a filling defect within the renal pelvis consistent with the patient's known calculus without other abnormalities.  EBL: Minimal  Specimens: None   Indication: BRELAN FLAIG is a 62 y.o. with a history of nonobstructing renal calculi seen in the office yesterday with bilateral ureteral calculi.  Serum creatinine was normal.  After reviewing the management options for treatment, the patient elected to proceed with the above surgical procedure(s). We have discussed the potential benefits and risks of the procedure, side effects of the proposed treatment, the likelihood of the patient achieving the goals of the procedure, and any  potential problems that might occur during the procedure or recuperation. Informed consent has been obtained.  Description of procedure:  The patient was taken to the operating room and general anesthesia was induced.  The patient was placed in the dorsal lithotomy position, prepped and draped in the usual sterile fashion, and preoperative antibiotics were administered. A preoperative time-out was performed.   A 21 French cystoscope was lubricated, passed per urethra and advanced proximally under direct vision with findings as described above.    Attention was directed to the left ureteral orifice and a 0.038 Sensor wire was then advanced up the  ureter into the renal pelvis under fluoroscopic guidance.  With wire placement stone migration to the superior portion of the left kidney was noted.  The cystoscope was removed and a dual-lumen catheter was placed over the Sensor wire and a second sensor wire was placed in a similar fashion.  A Lithovue Elite flexible ureteroscope was placed through over the working wire however would not advance more than 2 cm within the distal ureter.  The ureteroscope was removed and over the wire ureteral dilators were placed starting at 52F-10 F.  The ureteroscope was repassed and was able to be advanced to the mid ureter however would not advance any further.  The Sensor wire was replaced with Amplatz Super Stiff wire and the ureteroscope was able to be advanced to the renal pelvis.  Pyeloscopy was performed with findings as described above  A 200 m Moses holmium laser fiber was then placed through the ureteroscope and the calculus was dusted at a setting of 0.3J/80 Hz.  Additional noncontact laser lithotripsy was then performed at 0.6J/40 Hz until no fragments larger than the tip of the laser fiber were identified.    Retrograde pyelogram was then performed with ureteroscope and all calyces were examined and no significant size stone fragments were identified.  The  ureteroscope was removed under direct vision.  A 52F/24  cm Contour ureteral stent was then placed under fluoroscopic guidance.  The proximal tip was within an upper pole calyx and the distal end was well-positioned in the bladder.  The cystoscope was repassed and a 0.038 Sensor wire was placed into the right UO on fluoroscopic guidance and advanced into the renal pelvis.  It was difficult to visualize the calculus on fluoroscopy after wire placement and a 4.5 French semirigid ureteroscope was passed per urethra.  The right UO was engaged alongside the guidewire and advanced proximally.  The right ureter was noted to be tight to the 4.5 [place] scope but was able to be advanced to the mid proximal ureter as described above.  A second guidewire was placed and the semirigid ureteroscope was removed.  The ureteral dilators were advanced over the wire starting at 71F however the 10 F dilator would not advance.  The flexible ureteroscope was then placed over the working wire and would only advance into the distal ureter.  It was replaced with the Amplatz wire however the scope would not advance of the Amplatz wire.  It was elected at this point to place a ureteral stent.  The ureteroscope was removed and a 5 [place] open-ended ureteral catheter was placed over the wire.  Retrograde pyelogram was performed with findings as described above.  A 71F/24 cm Contour ureteral stent was placed under fluoroscopic guidance.  Adequate placement was noted both proximally and distally.  The bladder was then emptied with a cystoscope sheath and the procedure ended.  The patient appeared to tolerate the procedure well and without complications.  After anesthetic reversal the patient was transported to the PACU in stable condition.   Plan: The patient had expressed interest in SWL if either stone was unable to be treated ureteroscopically and will be scheduled   John Giovanni, MD

## 2022-05-18 NOTE — Progress Notes (Signed)
Patient awake/alert x4. Very anxious, states "pain" with urination. Noted blood and small clots initially when urinating. Medicated as ordered. Reviewed procedure with patient. Bil stents no strings, will come into office for removal. Patient verbalizes understanding.

## 2022-05-18 NOTE — Anesthesia Preprocedure Evaluation (Signed)
Anesthesia Evaluation  Patient identified by MRN, date of birth, ID band Patient awake    Reviewed: Allergy & Precautions, H&P , NPO status , Patient's Chart, lab work & pertinent test results, reviewed documented beta blocker date and time   Airway Mallampati: II  TM Distance: >3 FB Neck ROM: full    Dental  (+) Teeth Intact   Pulmonary neg pulmonary ROS   Pulmonary exam normal        Cardiovascular Exercise Tolerance: Good hypertension, On Medications negative cardio ROS Normal cardiovascular exam Rhythm:regular Rate:Normal     Neuro/Psych negative neurological ROS  negative psych ROS   GI/Hepatic negative GI ROS, Neg liver ROS,,,  Endo/Other  negative endocrine ROS    Renal/GU negative Renal ROS  negative genitourinary   Musculoskeletal   Abdominal   Peds  Hematology negative hematology ROS (+)   Anesthesia Other Findings Past Medical History: No date: Allergic rhinitis No date: HTN (hypertension) No date: LBP (low back pain) No date: Osteoarthritis Past Surgical History: 2012: EYE SURGERY; Bilateral     Comment:  R cataract BMI    Body Mass Index: 27.42 kg/m     Reproductive/Obstetrics negative OB ROS                             Anesthesia Physical Anesthesia Plan  ASA: 2  Anesthesia Plan: General LMA   Post-op Pain Management:    Induction:   PONV Risk Score and Plan:   Airway Management Planned:   Additional Equipment:   Intra-op Plan:   Post-operative Plan:   Informed Consent: I have reviewed the patients History and Physical, chart, labs and discussed the procedure including the risks, benefits and alternatives for the proposed anesthesia with the patient or authorized representative who has indicated his/her understanding and acceptance.     Dental Advisory Given  Plan Discussed with: CRNA  Anesthesia Plan Comments:        Anesthesia Quick  Evaluation

## 2022-05-18 NOTE — Telephone Encounter (Signed)
I spoke with Jeremy Johns. We have discussed possible surgery dates and Tuesday March 12th, 2024 was agreed upon by all parties. Patient given information about surgery date, what to expect pre-operatively and post operatively.  We discussed that a Pre-Admission Testing office will be calling to set up the pre-op visit that will take place prior to surgery, and that these appointments are typically done over the phone with a Pre-Admissions RN. Informed patient that our office will communicate any additional care to be provided after surgery. Patients questions or concerns were discussed during our call. Advised to call our office should there be any additional information, questions or concerns that arise. Patient verbalized understanding.

## 2022-05-18 NOTE — Progress Notes (Signed)
   [facility] From  Surgery Date: Date: 05/18/2022  Surgeon: Dr. John Giovanni, MD  Inpt ( No  )   Outpt (Yes)   Obs ( No  )   Diagnosis: N20.1 Bilateral Ureteral Stones  -CPT: 26333, 54562  Surgery: Right Ureteroscopy with Laser Lithotripsy and Stent Placement, Left Ureteral Stent Placement  Stop Anticoagulations: No  Cardiac/Medical/Pulmonary Clearance needed: no  *Orders entered into EPIC  Date: 05/18/22   *Case booked in EPIC  Date: 05/18/22  *Notified pt of Surgery: Date: 05/18/22  PRE-OP UA & CX: no  *Placed into Prior Authorization Work [place] Date: 05/18/22  Assistant/laser/rep:No

## 2022-05-18 NOTE — Progress Notes (Signed)
Surgical Physician Order Form [facility]  Dr. Bernardo Heater * Scheduling expectation :  05/18/2022  *Length of Case:   *Clearance needed: no  *Anticoagulation Instructions: N/A  *Aspirin Instructions: N/A  *Post-op visit Date/Instructions:   TBD  *Diagnosis:  Bilateral proximal ureteral stones  *Procedure: right Ureteroscopy w/laser lithotripsy & stent placement [id number]) and left ureteral stent placement   Additional orders: N/A  -Admit type: OUTpatient  -Anesthesia: General  -VTE Prophylaxis Standing Order SCD's       Other:   -Standing Lab Orders Per Anesthesia    Lab other: None  -Standing Test orders EKG/Chest x-ray per Anesthesia       Test other:   - Medications:  Ancef 2gm IV  -Other orders:  N/A

## 2022-05-18 NOTE — Anesthesia Procedure Notes (Signed)
Procedure Name: LMA Insertion Date/Time: 05/18/2022 2:43 PM  Performed by: Lily Peer, Darnell Jeschke, CRNAPre-anesthesia Checklist: Patient identified, Emergency Drugs available, Suction available and Patient being monitored Patient Re-evaluated:Patient Re-evaluated prior to induction Oxygen Delivery Method: Circle system utilized Preoxygenation: Pre-oxygenation with 100% oxygen Induction Type: IV induction Ventilation: Mask ventilation without difficulty LMA: LMA inserted LMA Size: 4.0 Number of attempts: 1 Placement Confirmation: positive ETCO2 and breath sounds checked- equal and bilateral Tube secured with: Tape Dental Injury: Teeth and Oropharynx as per pre-operative assessment

## 2022-05-19 ENCOUNTER — Telehealth: Payer: Self-pay | Admitting: Family Medicine

## 2022-05-19 ENCOUNTER — Encounter: Payer: Self-pay | Admitting: Urology

## 2022-05-19 ENCOUNTER — Other Ambulatory Visit: Payer: Self-pay | Admitting: Urology

## 2022-05-19 DIAGNOSIS — N200 Calculus of kidney: Secondary | ICD-10-CM

## 2022-05-19 MED ORDER — SODIUM CHLORIDE 0.9 % IV SOLN
1.0000 g | Freq: Once | INTRAVENOUS | Status: DC
  Filled 2022-05-19: qty 10

## 2022-05-19 MED ORDER — DIPHENHYDRAMINE HCL 25 MG PO CAPS: 25.0000 mg | ORAL_CAPSULE | ORAL | Status: AC

## 2022-05-19 MED ORDER — DIAZEPAM 5 MG PO TABS: 10.0000 mg | ORAL_TABLET | ORAL | Status: AC

## 2022-05-19 MED ORDER — ONDANSETRON HCL 4 MG/2ML IJ SOLN: 4.0000 mg | Freq: Once | INTRAMUSCULAR | Status: AC

## 2022-05-19 MED ORDER — SODIUM CHLORIDE 0.9 % IV SOLN: INTRAVENOUS | Status: DC

## 2022-05-19 NOTE — Progress Notes (Signed)
[facility] ORDER FORM  Expected date of procedure: 05/20/2022  Surgeon: Hollice Espy, MD  Post op standing: 2-4wk follow up w/KUB prior  Anticoagulation/Aspirin/NSAID standing order: Hold all 72 hours prior  Anesthesia standing order: MAC  VTE standing: SCD's  Dx: Right Nephrolihtiasis  Procedure: right Extracorporeal shock wave lithotripsy  CPT : [id number]  Standing Order Set:   *NPO after mn, KUB  *NS 173m/hr, Benadryl '25mg'$  PO, Valium '10mg'$  PO, Zofran '4mg'$  IV    Medications if other than standing orders:   Ceftriaxone(Rocephin) 1gm IV

## 2022-05-19 NOTE — Telephone Encounter (Signed)
Patient called after hours stating he had a stent placed yesterday and it is burning when he urinates. I called him back and informed him that having some burning is normal also flank pain and blood in urine are normal. I informed him that if he starts passing clots that are causing him not to be able to urinate for a fever greater that 101 to call our office. He states that today it has started to get a little better when urinating.

## 2022-05-20 ENCOUNTER — Ambulatory Visit
Admission: RE | Admit: 2022-05-20 | Discharge: 2022-05-20 | Disposition: A | Discharge status: Home / Self Care | Payer: 59 | Attending: Urology | Admitting: Urology

## 2022-05-20 ENCOUNTER — Encounter: Payer: Self-pay | Admitting: Urology

## 2022-05-20 ENCOUNTER — Other Ambulatory Visit: Payer: Self-pay

## 2022-05-20 ENCOUNTER — Encounter
Admission: RE | Disposition: A | Discharge status: Home / Self Care | Payer: Self-pay | Source: Home / Self Care | Attending: Urology

## 2022-05-20 ENCOUNTER — Telehealth: Payer: Self-pay | Admitting: Family Medicine

## 2022-05-20 ENCOUNTER — Ambulatory Visit: Payer: 59

## 2022-05-20 DIAGNOSIS — N201 Calculus of ureter: Secondary | ICD-10-CM | POA: Diagnosis present

## 2022-05-20 DIAGNOSIS — I10 Essential (primary) hypertension: Secondary | ICD-10-CM | POA: Insufficient documentation

## 2022-05-20 DIAGNOSIS — N200 Calculus of kidney: Secondary | ICD-10-CM

## 2022-05-20 DIAGNOSIS — Z87442 Personal history of urinary calculi: Secondary | ICD-10-CM | POA: Diagnosis not present

## 2022-05-20 DIAGNOSIS — M1990 Unspecified osteoarthritis, unspecified site: Secondary | ICD-10-CM | POA: Insufficient documentation

## 2022-05-20 DIAGNOSIS — R310 Gross hematuria: Secondary | ICD-10-CM | POA: Diagnosis not present

## 2022-05-20 HISTORY — PX: EXTRACORPOREAL SHOCK WAVE LITHOTRIPSY: SHX1557

## 2022-05-20 LAB — CULTURE, URINE COMPREHENSIVE

## 2022-05-20 SURGERY — LITHOTRIPSY, ESWL
Anesthesia: Moderate Sedation | Laterality: Right

## 2022-05-20 MED ORDER — CEFAZOLIN SODIUM-DEXTROSE 1-4 GM/50ML-% IV SOLN
INTRAVENOUS | Status: AC
  Filled 2022-05-20: qty 50

## 2022-05-20 MED ORDER — DIPHENHYDRAMINE HCL 25 MG PO CAPS
ORAL_CAPSULE | ORAL | Status: AC
  Administered 2022-05-20: 25 mg via ORAL
  Filled 2022-05-20: qty 1

## 2022-05-20 MED ORDER — ONDANSETRON HCL 4 MG/2ML IJ SOLN
INTRAMUSCULAR | Status: AC
  Administered 2022-05-20: 4 mg via INTRAVENOUS
  Filled 2022-05-20: qty 2

## 2022-05-20 MED ORDER — DIAZEPAM 5 MG PO TABS
ORAL_TABLET | ORAL | Status: AC
  Administered 2022-05-20: 10 mg via ORAL
  Filled 2022-05-20: qty 2

## 2022-05-20 MED ORDER — HYDROCODONE-ACETAMINOPHEN 5-325 MG PO TABS: 1.0000 | ORAL_TABLET | ORAL | 0 refills | Status: DC | PRN

## 2022-05-20 NOTE — Interval H&P Note (Signed)
History and Physical Interval Note:  05/20/2022 9:37 AM  Jeremy Johns  has presented today for surgery, with the diagnosis of Right Nephrolithiasis.  The various methods of treatment have been discussed with the patient and family. After consideration of risks, benefits and other options for treatment, the patient has consented to  Procedure(s): EXTRACORPOREAL SHOCK WAVE LITHOTRIPSY ([facility]) (Right) as a surgical intervention.  The patient's history has been reviewed, patient examined, no change in status, stable for surgery.  I have reviewed the patient's chart and labs.  Questions were answered to the patient's satisfaction.    RRR CTAB  S/p L urs, LL, bilateral stent  Returns today for treatment of residual right sided stone burden   Hollice Espy

## 2022-05-20 NOTE — Telephone Encounter (Signed)
Patient left message on triage line, he wants to know what are his work restriction post Bretta Bang done today? 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

## 2022-05-20 NOTE — Telephone Encounter (Signed)
Patient advised.

## 2022-05-20 NOTE — Anesthesia Postprocedure Evaluation (Signed)
Anesthesia Post Note  Patient: RAYMEL VANDONGEN  Procedure(s) Performed: CYSTOSCOPY/URETEROSCOPY/HOLMIUM LASER/STENT PLACEMENT (Left) CYSTOSCOPY WITH STENT PLACEMENT' DIAGNOSTIC URETEROSCOPY (Right)  Patient location during evaluation: PACU Anesthesia Type: General Level of consciousness: awake and alert Pain management: pain level controlled Vital Signs Assessment: post-procedure vital signs reviewed and stable Respiratory status: spontaneous breathing, nonlabored ventilation, respiratory function stable and patient connected to nasal cannula oxygen Cardiovascular status: blood pressure returned to baseline and stable Postop Assessment: no apparent nausea or vomiting Anesthetic complications: no   No notable events documented.   Last Vitals:  Vitals:   05/18/22 1645 05/18/22 1728  BP: (!) 147/89 136/85  Pulse: 92 78  Resp: 16 18  Temp: (!) 36.4 C (!) 36.1 C  SpO2: 98% 98%    Last Pain:  Vitals:   05/18/22 1728  TempSrc: Temporal  PainSc: [place]

## 2022-05-20 NOTE — Discharge Instructions (Addendum)
See [facility] discharge instructions in chart.  You have a ureteral stent in place.  This is a tube that extends from your kidney to your bladder.  This may cause urinary bleeding, burning with urination, and urinary frequency.  Please call our office or present to the ED if you develop fevers >101 or pain which is not able to be controlled with oral pain medications.  You may be given either Flomax and/ or ditropan to help with bladder spasms and stent pain in addition to pain medications.    [facility] [address], [place], [place] [address] [phone]  AMBULATORY SURGERY  DISCHARGE INSTRUCTIONS   The drugs that you were given will stay in your system until tomorrow so for the next 24 hours you should not:  Drive an automobile Make any legal decisions Drink any alcoholic beverage   You may resume regular meals tomorrow.  Today it is better to start with liquids and gradually work up to solid foods.  You may eat anything you prefer, but it is better to start with liquids, then soup and crackers, and gradually work up to solid foods.   Please notify your doctor immediately if you have any unusual bleeding, trouble breathing, redness and pain at the surgery site, drainage, fever, or pain not relieved by medication.    Additional Instructions:        Please contact your physician with any problems or Same Day Surgery at [phone], Monday through Friday 6 am to 4 pm, or [place] at [facility] number at [phone].

## 2022-05-21 ENCOUNTER — Telehealth: Payer: Self-pay

## 2022-05-21 NOTE — Telephone Encounter (Signed)
Patient states he has had 2 recent surgeries and has not had a bowel movement in the last 3 days. He has tried Dulcolax x 2 and wonders what else he can try.

## 2022-05-21 NOTE — Telephone Encounter (Signed)
Patient advised.

## 2022-05-21 NOTE — Telephone Encounter (Signed)
Recommend MiraLAX or Senokot-S

## 2022-05-24 ENCOUNTER — Encounter: Payer: Self-pay | Admitting: Urology

## 2022-05-24 NOTE — Telephone Encounter (Signed)
Pt calling asking for after care with stents, spoke with patient and answered all questions. Pt is still constipated and will continue to use miralax due to only a little relief.   Tips for tolerating your ureteral stent after your urologic procedure Your urologist placed a stent into your ureter (the tube that connects your kidney to your bladder). This ureteral stent functions much like a tiny straw that has as small coil in the kidney and in the bladder to help hold it in place and to keep your ureter open until the healing process is complete. If the stent is removed prior to the resolution of swelling, you may experience significant discomfort. This pain is very similar to the pain experienced with a kidney or ureteral stone. Therefore, we urge you to leave the stent in for the prescribed amount of time.  To improve the tolerability of the stent, several medications are available.   This is a medication that helps relieve bladder cramps and spasms often experienced with a ureteral stent.    Frequently Asked Questions about Ureteral Stents: Q Is it normal to have discomfort present? A Yes. In fact, certain movements may increase discomfort. Examples include: frequent bending and increased activity. Q Is it normal to feel like I have to urinate more often? A Yes,  Q Is it normal to feel pain or pressure before and/or during urination? A Yes, some patients have significant pain in the back during urination because the stent allows urine to flow back toward the kidney. Others may experience pain in the bladder because the stent may cause a bladder cramp during or at the end of urination. Q Is it normal to have blood in my urine and what if it is not present all the time? A Yes. The stent will cause some irritation to tissue, it may be present while you have the stent and after you remove the stent. The blood may be a light red or darker at times. Don't be alarmed if the blood resolves  and then returns. This is typical for most patients

## 2022-06-02 ENCOUNTER — Telehealth: Payer: Self-pay

## 2022-06-02 NOTE — Telephone Encounter (Signed)
Patient called stating that he has 2 stents in and has an appointment tomorrow 06/03/22 to get this removed. He noticed some lower abdominal and right side flank pain that started yesterday, dull ache intense. Today pain is better. He has been urinating with blood off and on but today has blood every time he urinates. Color described as red in between dark and light, not think and no clots. No fever or vomiting. I spoke with Morey Hummingbird and advised patient this is normal but to lt [place] know if symptoms become or worse or he is not able to urinate. Patient understood.

## 2022-06-03 ENCOUNTER — Ambulatory Visit
Admission: RE | Admit: 2022-06-03 | Discharge: 2022-06-03 | Disposition: A | Discharge status: Home / Self Care | Payer: 59 | Source: Ambulatory Visit | Attending: Urology | Admitting: Urology

## 2022-06-03 ENCOUNTER — Other Ambulatory Visit: Payer: Self-pay | Admitting: *Deleted

## 2022-06-03 ENCOUNTER — Ambulatory Visit
Admission: RE | Admit: 2022-06-03 | Discharge: 2022-06-03 | Disposition: A | Discharge status: Home / Self Care | Payer: 59 | Attending: Urology | Admitting: Urology

## 2022-06-03 ENCOUNTER — Encounter: Payer: Self-pay | Admitting: Urology

## 2022-06-03 ENCOUNTER — Ambulatory Visit (INDEPENDENT_AMBULATORY_CARE_PROVIDER_SITE_OTHER): Payer: 59 | Admitting: Urology

## 2022-06-03 VITALS — BP 162/94 | HR 92 | Ht 67.0 in | Wt 175.0 lb

## 2022-06-03 DIAGNOSIS — Z87442 Personal history of urinary calculi: Secondary | ICD-10-CM

## 2022-06-03 DIAGNOSIS — N200 Calculus of kidney: Secondary | ICD-10-CM

## 2022-06-03 DIAGNOSIS — Z466 Encounter for fitting and adjustment of urinary device: Secondary | ICD-10-CM

## 2022-06-03 MED ORDER — CIPROFLOXACIN HCL 500 MG PO TABS
500.0000 mg | ORAL_TABLET | Freq: Once | ORAL | Status: AC
  Administered 2022-06-03: 500 mg via ORAL

## 2022-06-03 NOTE — Progress Notes (Signed)
   Indications: Patient is 62 y.o. with a history of bilateral ureteral calculi.  He is s/p ureteroscopic removal of a 9 mm left calculus 05/18/2022.  The upper right ureter could not be accessed with the ureteroscope and a stent was placed.  He did SWL of his right ureteral stone which was performed 05/20/2022.  The patient is presenting today for bilateral stent removal.  KUB performed earlier this morning shows stents in good position and no calculus fragments  Procedure:  Flexible Cystoscopy with stent removal [id number])  Timeout was performed and the correct patient, procedure and participants were identified.    Description:  The patient was prepped and draped in the usual sterile fashion. Flexible cystosopy was performed.  The right stent was visualized, grasped, and removed intact without difficulty.  The cystoscope was repassed and the left ureteral stent was removed in a similar fashion the patient tolerated the procedure well.  A single dose of oral antibiotics was given.  Complications:  None  Plan:  Recommend metabolic evaluation through [place]; will contact with results 6 month follow-up office visit with KUB    John Giovanni, MD

## 2022-06-03 NOTE — Patient Instructions (Signed)

## 2022-06-04 ENCOUNTER — Ambulatory Visit: Payer: No Typology Code available for payment source | Admitting: Internal Medicine

## 2022-06-04 LAB — URINALYSIS, COMPLETE
Bilirubin, UA: NEGATIVE
Glucose, UA: NEGATIVE
Ketones, UA: NEGATIVE
Nitrite, UA: NEGATIVE
Specific Gravity, UA: 1.025 (ref 1.005–1.030)
Urobilinogen, Ur: 0.2 mg/dL (ref 0.2–1.0)
pH, UA: 7 (ref 5.0–7.5)

## 2022-06-04 LAB — MICROSCOPIC EXAMINATION: RBC, Urine: >30 /hpf — AB (ref 0–2)

## 2022-06-14 ENCOUNTER — Ambulatory Visit: Payer: No Typology Code available for payment source | Admitting: Internal Medicine

## 2022-06-21 ENCOUNTER — Encounter: Payer: Self-pay | Admitting: Internal Medicine

## 2022-06-21 ENCOUNTER — Ambulatory Visit: Payer: 59 | Admitting: Internal Medicine

## 2022-06-21 VITALS — BP 130/78 | HR 89 | Temp 96.8°F | Wt 173.0 lb

## 2022-06-21 DIAGNOSIS — R202 Paresthesia of skin: Secondary | ICD-10-CM | POA: Diagnosis not present

## 2022-06-21 DIAGNOSIS — M47816 Spondylosis without myelopathy or radiculopathy, lumbar region: Secondary | ICD-10-CM | POA: Diagnosis not present

## 2022-06-21 DIAGNOSIS — Z6827 Body mass index (BMI) 27.0-27.9, adult: Secondary | ICD-10-CM

## 2022-06-21 DIAGNOSIS — I10 Essential (primary) hypertension: Secondary | ICD-10-CM

## 2022-06-21 DIAGNOSIS — E663 Overweight: Secondary | ICD-10-CM

## 2022-06-21 DIAGNOSIS — N200 Calculus of kidney: Secondary | ICD-10-CM | POA: Insufficient documentation

## 2022-06-21 DIAGNOSIS — Z1211 Encounter for screening for malignant neoplasm of colon: Secondary | ICD-10-CM | POA: Diagnosis not present

## 2022-06-21 MED ORDER — LOSARTAN POTASSIUM 50 MG PO TABS: ORAL_TABLET | ORAL | 1 refills | Status: DC

## 2022-06-21 NOTE — Assessment & Plan Note (Signed)
Following with urology

## 2022-06-21 NOTE — Patient Instructions (Signed)
Paresthesia Paresthesia is a burning or prickling feeling. This feeling can happen in any part of the body. It often happens in the hands, arms, legs, or feet. Usually, it is not painful. In most cases, the feeling goes away in a short time and is not a sign of a serious problem. If you have paresthesia that lasts a long time, you need to see your doctor. Follow these instructions at home: Nutrition Eat a healthy diet. This includes: Eating foods that are high in fiber. These include beans, whole grains, and fresh fruits and vegetables. Limiting foods that are high in fat and sugar. These include fried or sweet foods.  Alcohol use  Do not drink alcohol if: Your doctor tells you not to drink. You are pregnant, may be pregnant, or are planning to become pregnant. If you drink alcohol: Limit how much you have to: 0-1 drink a day for women. 0-2 drinks a day for men. Know how much alcohol is in your drink. In the [place] one drink equals one 12 oz bottle of beer (355 mL), one 5 oz glass of wine (148 mL), or one 1 oz glass of hard liquor (44 mL). General instructions Take over-the-counter and prescription medicines only as told by your doctor. Do not smoke or use any products that contain nicotine or tobacco. If you need help quitting, ask your doctor. If you have diabetes, work with your doctor to make sure your blood sugar stays in a healthy range. If your feet feel numb: Check for redness, warmth, and swelling every day. Wear padded socks and comfortable shoes. These help protect your feet. Keep all follow-up visits. Contact a doctor if: You have paresthesia that gets worse or does not go away. You lose feeling (have numbness) after an injury. Your burning or prickling feeling gets worse when you walk. You have pain or cramps. You feel dizzy or you faint. You have a rash. Get help right away if: You feel weak or have new weakness in an arm or leg. You have trouble walking or  moving. You have problems speaking, understanding, or seeing. You feel confused. You cannot control when you pee (urinate) or poop (have a bowel movement). These symptoms may be an emergency. Get help right away. Call 911. Do not wait to see if the symptoms will go away. Do not drive yourself to the hospital. Summary Paresthesia is a burning or prickling feeling. It often happens in the hands, arms, legs, or feet. In most cases, the feeling goes away in a short time and is not a sign of a serious problem. If you have paresthesia that lasts a long time, you need to be seen by your doctor. This information is not intended to replace advice given to you by your health care provider. Make sure you discuss any questions you have with your health care provider. Document Revised: 11/03/2020 Document Reviewed: 11/03/2020 Elsevier Patient Education  2023 [employer].  

## 2022-06-21 NOTE — Assessment & Plan Note (Signed)
Controlled on losartan, refilled today Reinforced DASH diet and exercise for weight loss Kidney function reviewed

## 2022-06-21 NOTE — Assessment & Plan Note (Signed)
Encourage diet and exercise for weight loss 

## 2022-06-21 NOTE — Assessment & Plan Note (Signed)
Okay to continue Tylenol or ibuprofen OTC as needed

## 2022-06-21 NOTE — Progress Notes (Signed)
Subjective:    Patient ID: Jeremy Johns, male    DOB: 09/03/1960, 62 y.o.   MRN: [id number]  HPI  Patient presents to clinic today for follow-up of chronic conditions.  HTN: His BP today is 130/78.  He is taking Losartan as prescribed.  ECG from 05/2022 reviewed.  OA: Mainly in his wrist and hands.  He takes Ibuprofen or Tylenol as needed with good relief of symptoms.  He does not follow with orthopedics.  History of Kidney Stones: Status post lithotripsy and stent placement/removal 05/2022.  He follows with urology.  Review of Systems     Past Medical History:  Diagnosis Date   Allergic rhinitis    HTN (hypertension)    LBP (low back pain)    Osteoarthritis     Current Outpatient Medications  Medication Sig Dispense Refill   Cholecalciferol (VITAMIN D3) 50 MCG (2000 UT) TABS Take by mouth.     HYDROcodone-acetaminophen (NORCO/VICODIN) 5-325 MG tablet Take 1 tablet by mouth every 4 (four) hours as needed for moderate pain. 10 tablet 0   losartan (COZAAR) 50 MG tablet TAKE 1 AND 1/2 TABLETS DAILY BY MOUTH 135 tablet 1   Multiple Vitamin (MULTIVITAMIN) tablet Take 1 tablet by mouth daily.     oxybutynin (DITROPAN) 5 MG tablet 1 tab tid prn frequency,urgency, bladder spasm 30 tablet 0   tamsulosin (FLOMAX) 0.4 MG CAPS capsule Take 1 capsule (0.4 mg total) by mouth daily. 30 capsule 0   No current facility-administered medications for this visit.    No Known Allergies  Family History  Problem Relation Age of Onset   Diabetes Mother    Hypertension Mother    Diabetes Father    Hypertension Father    Hypertension Unknown    Diabetes Unknown     Social History   Socioeconomic History   Marital status: Married    Spouse name: Not on file   Number of children: Not on file   Years of education: Not on file   Highest education level: 12th grade  Occupational History   Not on file  Tobacco Use   Smoking status: Never   Smokeless tobacco: Never  Substance and  Sexual Activity   Alcohol use: No   Drug use: No   Sexual activity: Yes  Other Topics Concern   Not on file  Social History Narrative   Not on file   Social Determinants of Health   Financial Resource Strain: Low Risk  (06/18/2022)   Overall Financial Resource Strain (CARDIA)    Difficulty of Paying Living Expenses: Not hard at all  Food Insecurity: No Food Insecurity (06/18/2022)   Hunger Vital Sign    Worried About Running Out of Food in the Last Year: Never true    Ran Out of Food in the Last Year: Never true  Transportation Needs: No Transportation Needs (06/18/2022)   PRAPARE - Administrator, Civil Service (Medical): No    Lack of Transportation (Non-Medical): No  Physical Activity: Sufficiently Active (06/18/2022)   Exercise Vital Sign    Days of Exercise per Week: 5 days    Minutes of Exercise per Session: 30 min  Stress: No Stress Concern Present (06/18/2022)   [employer] of Occupational Health - Occupational Stress Questionnaire    Feeling of Stress : Not at all  Social Connections: Unknown (06/18/2022)   Social Connection and Isolation Panel [NHANES]    Frequency of Communication with Friends and Family: Not on file  Frequency of Social Gatherings with Friends and Family: Not on file    Attends Religious Services: Not on file    Active Member of Clubs or Organizations: No    Attends Banker Meetings: Not on file    Marital Status: Not on file  Intimate Partner Violence: Not on file     Constitutional: Denies fever, malaise, fatigue, headache or abrupt weight changes.  HEENT: Denies eye pain, eye redness, ear pain, ringing in the ears, wax buildup, runny nose, nasal congestion, bloody nose, or sore throat. Respiratory: Denies difficulty breathing, shortness of breath, cough or sputum production.   Cardiovascular: Denies chest pain, chest tightness, palpitations or swelling in the hands or feet.  Gastrointestinal: Denies abdominal pain,  bloating, constipation, diarrhea or blood in the stool.  GU: Denies urgency, frequency, pain with urination, burning sensation, blood in urine, odor or discharge. Musculoskeletal: Patient reports joint pain.  Denies decrease in range of motion, difficulty with gait, muscle pain or joint swelling.  Skin: Denies redness, rashes, lesions or ulcercations.  Neurological: Pt reports paresthesia of left arm. Denies dizziness, difficulty with memory, difficulty with speech or problems with balance and coordination.  Psych: Denies anxiety, depression, SI/HI.  No other specific complaints in a complete review of systems (except as listed in HPI above).  Objective:   Physical Exam  BP 130/78 (BP Location: Left Arm, Patient Position: Sitting, Cuff Size: Normal)   Pulse 89   Temp (!) 96.8 F (36 C) (Temporal)   Wt 173 lb (78.5 kg)   SpO2 100%   BMI 27.10 kg/m   Wt Readings from Last 3 Encounters:  06/03/22 175 lb (79.4 kg)  05/20/22 175 lb (79.4 kg)  05/18/22 175 lb 0.7 oz (79.4 kg)    General: Appears his stated age, overweight in NAD. Skin: Warm, dry and intact.  HEENT: Head: normal shape and size; Eyes: sclera white, no icterus, conjunctiva pink, PERRLA and EOMs intact;  Neck:  Neck supple, trachea midline. No masses, lumps or thyromegaly present.  Cardiovascular: Normal rate and rhythm. S1,S2 noted.  No murmur, rubs or gallops noted. No JVD or BLE edema.  Radial pulse 2+ on the left Pulmonary/Chest: Normal effort and positive vesicular breath sounds. No respiratory distress. No wheezes, rales or ronchi noted.  Musculoskeletal: Normal flexion, extension, rotation and lateral bending of the spine.  No bony tenderness noted over the cervical spine.  Normal internal and external rotation of the left shoulder.  No pain with palpation of the left shoulder.  Shoulder shrug equal.  Strength 5/5 BUE.  Handgrips equal.  Joint enlargement noted in hands. \ No difficulty with gait.  Neurological: Alert  and oriented.  Coordination normal.     BMET    Component Value Date/Time   NA 139 05/17/2022 1621   K 3.9 05/17/2022 1621   CL 107 05/17/2022 1621   CO2 24 05/17/2022 1621   GLUCOSE 87 05/17/2022 1621   BUN 18 05/17/2022 1621   CREATININE 0.84 05/17/2022 1621   CREATININE 0.95 10/13/2021 1537   CALCIUM 9.3 05/17/2022 1621   GFRNONAA >60 05/17/2022 1621   GFRAA 104 07/11/2007 1017    Lipid Panel     Component Value Date/Time   CHOL 185 10/13/2021 1537   TRIG 133 10/13/2021 1537   HDL 48 10/13/2021 1537   CHOLHDL 3.9 10/13/2021 1537   VLDL 16.4 05/20/2020 0839   LDLCALC 112 (H) 10/13/2021 1537    CBC    Component Value Date/Time  WBC 8.8 10/13/2021 1537   RBC 5.45 10/13/2021 1537   HGB 16.5 10/13/2021 1537   HCT 48.6 10/13/2021 1537   PLT 295 10/13/2021 1537   MCV 89.2 10/13/2021 1537   MCH 30.3 10/13/2021 1537   MCHC 34.0 10/13/2021 1537   RDW 12.5 10/13/2021 1537   LYMPHSABS 2.5 01/10/2012 0841   MONOABS 0.5 01/10/2012 0841   EOSABS 0.1 01/10/2012 0841   BASOSABS 0.0 01/10/2012 0841    Hgb A1C Lab Results  Component Value Date   HGBA1C 5.4 10/13/2021           Assessment & Plan:   Paresthesia of Left Arm:  Discuss potential diagnosis of cervical radiculitis versus shoulder impingement He is not interested in any further workup at this time Will monitor for now  RTC in 6 months for annual exam Nicki Reaper, NP

## 2022-06-27 ENCOUNTER — Ambulatory Visit
Admission: EM | Admit: 2022-06-27 | Discharge: 2022-06-27 | Disposition: A | Discharge status: Home / Self Care | Payer: 59 | Attending: Emergency Medicine | Admitting: Emergency Medicine

## 2022-06-27 DIAGNOSIS — R350 Frequency of micturition: Secondary | ICD-10-CM | POA: Insufficient documentation

## 2022-06-27 DIAGNOSIS — R300 Dysuria: Secondary | ICD-10-CM | POA: Insufficient documentation

## 2022-06-27 DIAGNOSIS — R1030 Lower abdominal pain, unspecified: Secondary | ICD-10-CM | POA: Insufficient documentation

## 2022-06-27 LAB — POCT URINALYSIS DIP (MANUAL ENTRY)
Bilirubin, UA: NEGATIVE
Glucose, UA: 100 mg/dL — AB
Leukocytes, UA: NEGATIVE
Nitrite, UA: POSITIVE — AB
Protein Ur, POC: 30 mg/dL — AB
Spec Grav, UA: 1.02 (ref 1.010–1.025)
Urobilinogen, UA: 1 E.U./dL
pH, UA: 5 (ref 5.0–8.0)

## 2022-06-27 MED ORDER — CEPHALEXIN 500 MG PO CAPS: 500.0000 mg | ORAL_CAPSULE | Freq: Two times a day (BID) | ORAL | 0 refills | Status: AC

## 2022-06-27 NOTE — ED Triage Notes (Signed)
Patient to Urgent Care with complaints of urinary frequency, right sided groin pain. Denies any flank pain. Denies any known fevers.   Symptoms started Friday. Reports having two kidney stones in March w/ stents.   Taking azo.

## 2022-06-27 NOTE — ED Provider Notes (Signed)
Jeremy Johns    CSN: [id number] Arrival date & time: 06/27/22  4098      History   Chief Complaint Chief Complaint  Patient presents with   Urinary Frequency    HPI Jeremy Johns is a 62 y.o. male.  Patient presents with 2-day history of dysuria, urinary frequency, lower abdominal pain.  He took Azo this morning.  He denies fever, chills, flank pain, hematuria, penile discharge, testicular pain, or other symptoms.  His medical history includes hypertension and kidney stones. He had lithotripsy and stents last month.  The history is provided by the patient and medical records.    Past Medical History:  Diagnosis Date   Allergic rhinitis    HTN (hypertension)    LBP (low back pain)    Osteoarthritis     Patient Active Problem List   Diagnosis Date Noted   Kidney stones 06/21/2022   Overweight with body mass index (BMI) of 27 to 27.9 in adult 09/18/2020   Essential hypertension 01/16/2007   Osteoarthritis 01/16/2007    Past Surgical History:  Procedure Laterality Date   CYSTOSCOPY WITH STENT PLACEMENT Right 05/18/2022   Procedure: CYSTOSCOPY WITH STENT PLACEMENT' DIAGNOSTIC URETEROSCOPY;  Surgeon: Riki Altes, MD;  Location: [facility];  Service: Urology;  Laterality: Right;   CYSTOSCOPY/URETEROSCOPY/HOLMIUM LASER/STENT PLACEMENT Left 05/18/2022   Procedure: CYSTOSCOPY/URETEROSCOPY/HOLMIUM LASER/STENT PLACEMENT;  Surgeon: Riki Altes, MD;  Location: [facility];  Service: Urology;  Laterality: Left;   EXTRACORPOREAL SHOCK WAVE LITHOTRIPSY Right 05/20/2022   Procedure: EXTRACORPOREAL SHOCK WAVE LITHOTRIPSY (ESWL);  Surgeon: Vanna Scotland, MD;  Location: [facility];  Service: Urology;  Laterality: Right;   EYE SURGERY Bilateral 2012   R cataract       Home Medications    Prior to Admission medications   Medication Sig Start Date End Date Taking? Authorizing Provider  cephALEXin (KEFLEX) 500 MG capsule Take 1 capsule (500 mg total) by mouth 2 (two) times  daily for 5 days. 06/27/22 07/02/22 Yes Mickie Bail, NP  Cholecalciferol (VITAMIN D3) 50 MCG (2000 UT) TABS Take by mouth.    [provider]  losartan (COZAAR) 50 MG tablet TAKE 1 AND 1/2 TABLETS DAILY BY MOUTH 06/21/22   Lorre Munroe, NP  Multiple Vitamin (MULTIVITAMIN) tablet Take 1 tablet by mouth daily.    [provider]    Family History Family History  Problem Relation Age of Onset   Diabetes Mother    Hypertension Mother    Diabetes Father    Hypertension Father    Hypertension Unknown    Diabetes Unknown     Social History Social History   Tobacco Use   Smoking status: Never   Smokeless tobacco: Never  Substance Use Topics   Alcohol use: No   Drug use: No     Allergies   Patient has no known allergies.   Review of Systems Review of Systems  Constitutional:  Negative for chills and unexpected weight change.  Gastrointestinal:  Positive for abdominal pain. Negative for nausea and vomiting.  Genitourinary:  Positive for dysuria and frequency. Negative for flank pain, hematuria, penile discharge and testicular pain.  Skin:  Negative for color change, rash and wound.     Physical Exam Triage Vital Signs ED Triage Vitals  Enc Vitals Group     BP 06/27/22 1022 (!) 155/90     Pulse Rate 06/27/22 1016 79     Resp 06/27/22 1016 18     Temp 06/27/22 1016  97.9 F (36.6 C)     Temp src --      SpO2 06/27/22 1016 97 %     Weight 06/27/22 1020 175 lb (79.4 kg)     Height 06/27/22 1020  (1.702 m)     Head Circumference --      Peak Flow --      Pain Score 06/27/22 1018 1     Pain Loc --      Pain Edu? --      Excl. in GC? --    No data found.  Updated Vital Signs BP (!) 155/90   Pulse 79   Temp 97.9 F (36.6 C)   Resp 18   Ht  (1.702 m)   Wt 175 lb (79.4 kg)   SpO2 97%   BMI 27.41 kg/m   Visual Acuity Right Eye Distance:   Left Eye Distance:   Bilateral Distance:    Right Eye Near:   Left Eye Near:    Bilateral  Near:     Physical Exam Vitals and nursing note reviewed.  Constitutional:      General: He is not in acute distress.    Appearance: Normal appearance. He is well-developed. He is not ill-appearing.  HENT:     Mouth/Throat:     Mouth: Mucous membranes are moist.  Cardiovascular:     Rate and Rhythm: Normal rate and regular rhythm.     Heart sounds: Normal heart sounds.  Pulmonary:     Effort: Pulmonary effort is normal. No respiratory distress.     Breath sounds: Normal breath sounds.  Abdominal:     General: Bowel sounds are normal.     Palpations: Abdomen is soft.     Tenderness: There is no abdominal tenderness. There is no right CVA tenderness, left CVA tenderness, guarding or rebound.  Musculoskeletal:     Cervical back: Neck supple.  Skin:    General: Skin is warm and dry.  Neurological:     Mental Status: He is alert.  Psychiatric:        Mood and Affect: Mood normal.        Behavior: Behavior normal.      UC Treatments / Results  Labs (all labs ordered are listed, but only abnormal results are displayed) Labs Reviewed  POCT URINALYSIS DIP (MANUAL ENTRY) - Abnormal; Notable for the following components:      Result Value   Color, UA orange (*)    Glucose, UA =100 (*)    Ketones, POC UA trace (5) (*)    Blood, UA moderate (*)    Protein Ur, POC =30 (*)    Nitrite, UA Positive (*)    All other components within normal limits  URINE CULTURE    EKG   Radiology No results found.  Procedures Procedures (including critical care time)  Medications Ordered in UC Medications - No data to display  Initial Impression / Assessment and Plan / UC Course  I have reviewed the triage vital signs and the nursing notes.  Pertinent labs & imaging results that were available during my care of the patient were reviewed by me and considered in my medical decision making (see chart for details).    Dysuria, urinary frequency, lower abdominal pain.  Patient has history  of kidney stones and recently had lithotripsy with stents.  Afebrile and vital signs are stable.  Abdomen is soft and nontender with good bowel sounds, no CVAT.  Patient reports his current  symptoms are Not similar to what he had with kidney stones.  He took an Azo this morning.  His urine is positive for nitrite.  Treating with Keflex. Urine culture pending. Discussed with patient that we will call him if the urine culture shows the need to change or discontinue the antibiotic. Instructed him to follow-up with his PCP tomorrow.  ED precautions discussed.  Education provided on dysuria and abdominal pain.  Patient agrees to plan of care.     Final Clinical Impressions(s) / UC Diagnoses   Final diagnoses:  Urinary frequency  Dysuria  Lower abdominal pain     Discharge Instructions      Go to the emergency department if you have worsening symptoms.    Take the antibiotic as directed.  The urine culture is pending.  We will call you if it shows the need to change or discontinue your antibiotic.    Follow up with your primary care provider tomorrow.        ED Prescriptions     Medication Sig Dispense Auth. Provider   cephALEXin (KEFLEX) 500 MG capsule Take 1 capsule (500 mg total) by mouth 2 (two) times daily for 5 days. 10 capsule Mickie Bail, NP      PDMP not reviewed this encounter.   Mickie Bail, NP 06/27/22 [phone]

## 2022-06-27 NOTE — Discharge Instructions (Addendum)
Go to the emergency department if you have worsening symptoms.    Take the antibiotic as directed.  The urine culture is pending.  We will call you if it shows the need to change or discontinue your antibiotic.    Follow up with your primary care provider tomorrow.

## 2022-06-28 LAB — URINE CULTURE: Culture: NO GROWTH

## 2022-06-29 ENCOUNTER — Encounter: Payer: Self-pay | Admitting: *Deleted

## 2022-06-29 IMAGING — CT CT RENAL STONE PROTOCOL
2 of 4 series · 15 of 46 positions shown, 17 images · non-contrast
Comparison: None.

CLINICAL DATA: Nephrolithiasis. History of kidney stones. Recent
left flank pain extending into the left scrotum.



[Series 2: stone full standard · axial · 0.78mm/px · z∈[-779,-354]mm · 12 of 95 slices shown, 14 images]
[im 5/95  soft-tissue]
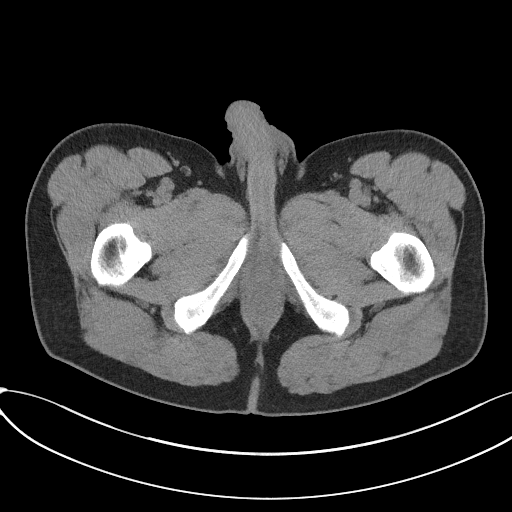
[im 5/95  bone]
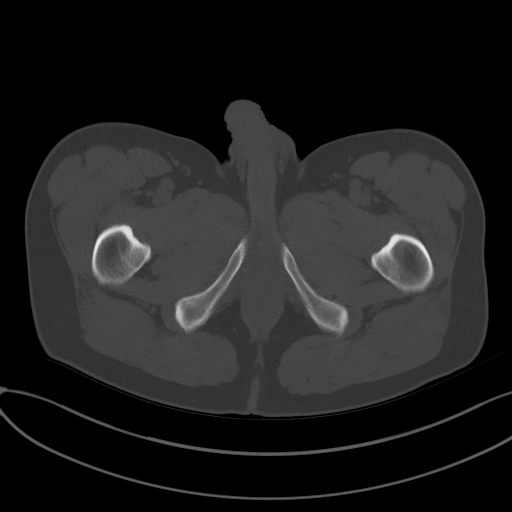
[im 13/95  soft-tissue]
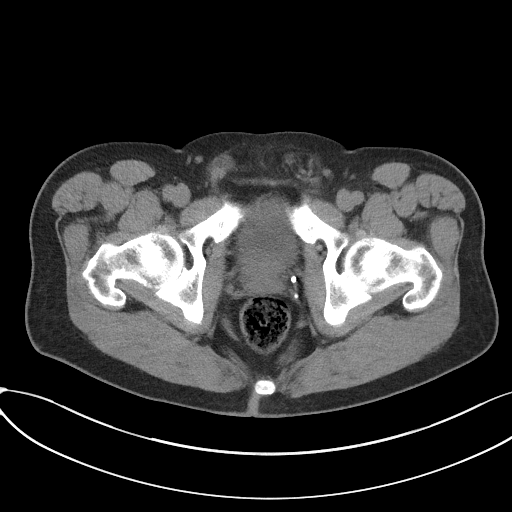
[im 22/95  soft-tissue]
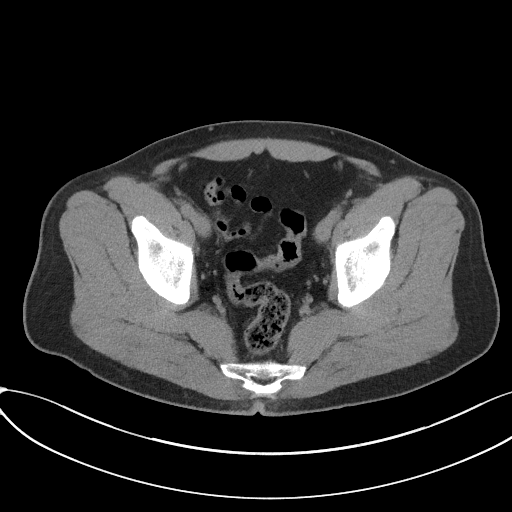
[im 30/95  soft-tissue]
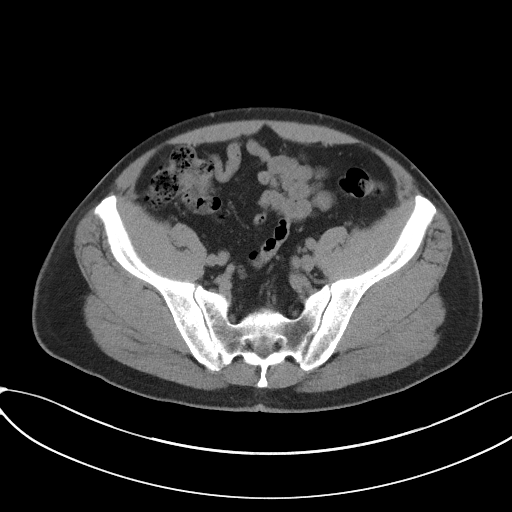
[im 35/95  soft-tissue]
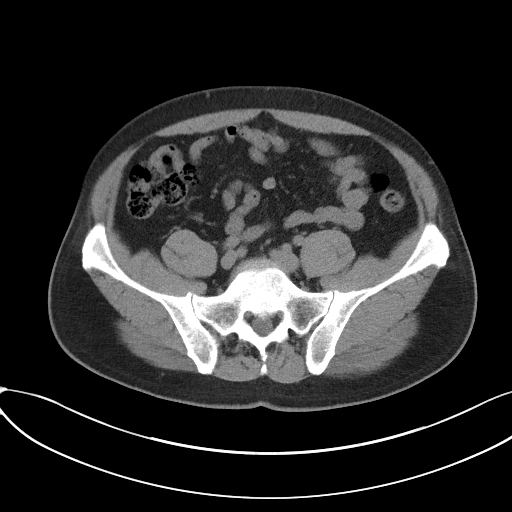
[im 43/95  soft-tissue]
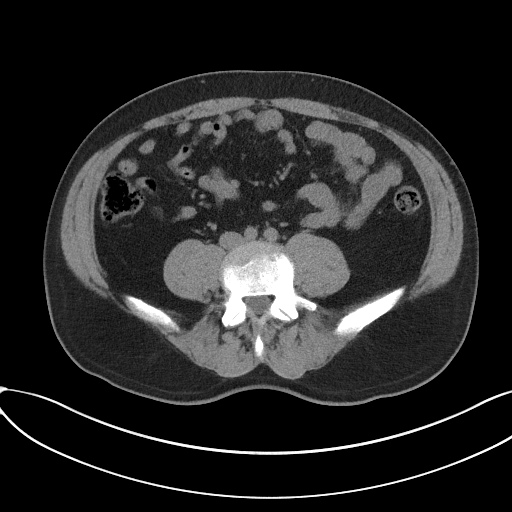
[im 52/95  soft-tissue]
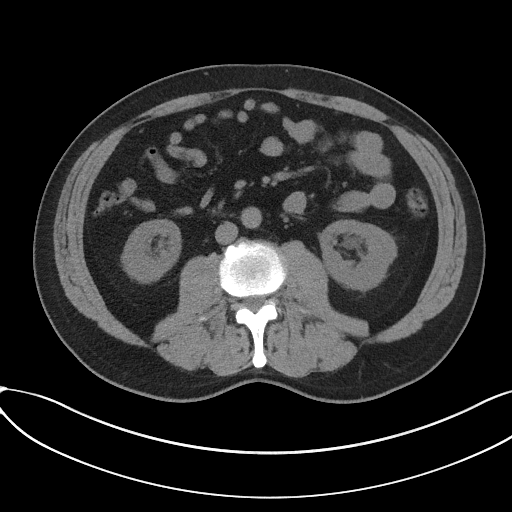
[im 60/95  soft-tissue]
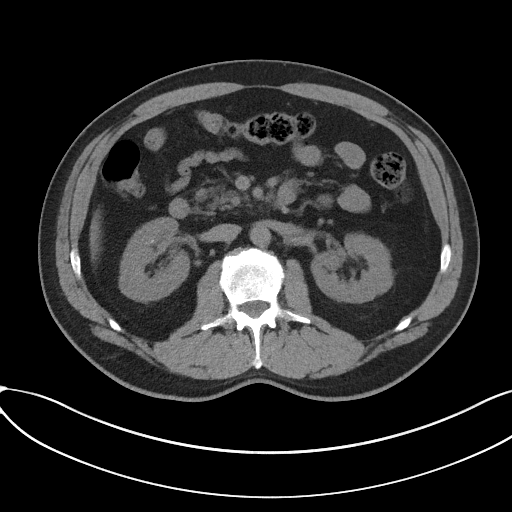
[im 65/95  soft-tissue]
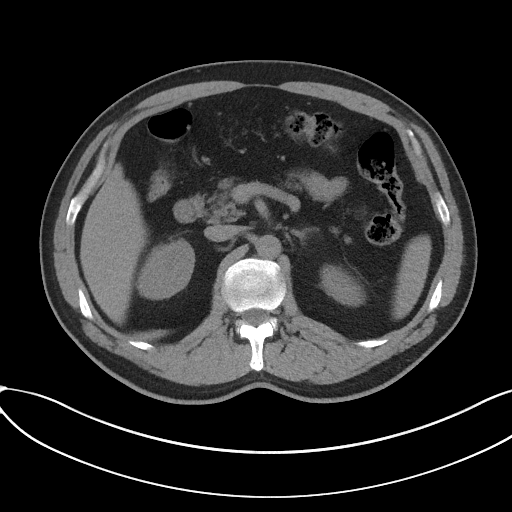
[im 65/95  bone]
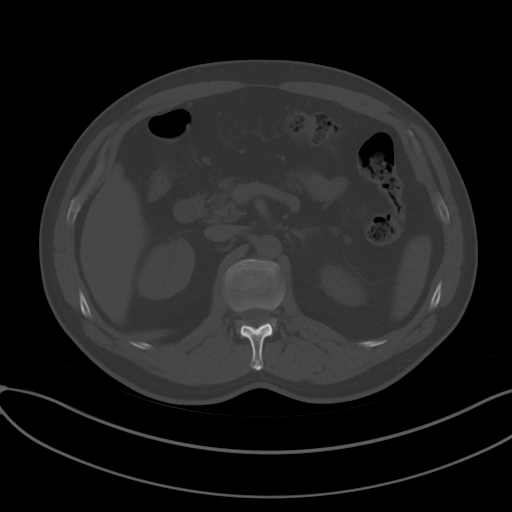
[im 73/95  soft-tissue]
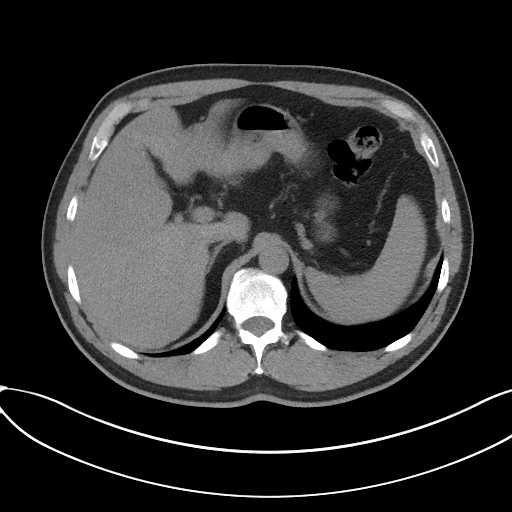
[im 82/95  soft-tissue]
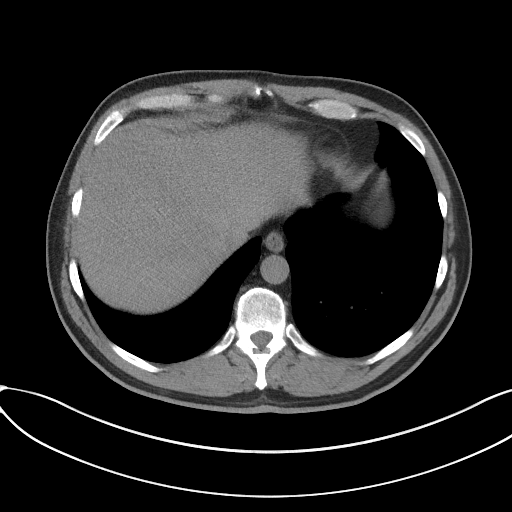
[im 90/95  soft-tissue]
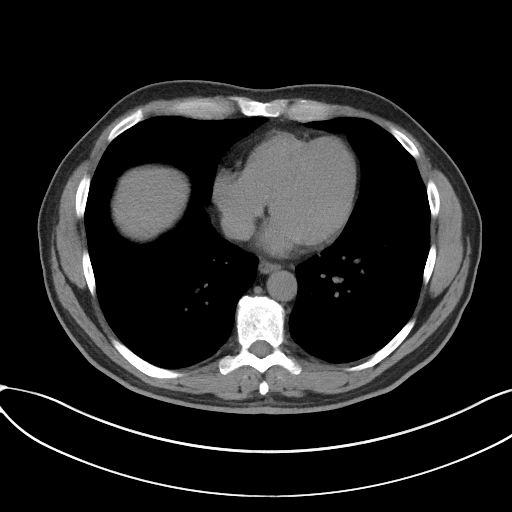

[Series 5: coronal · coronal · 0.73mm/px · 3 of 137 slices shown]
[im 46/137  soft-tissue]
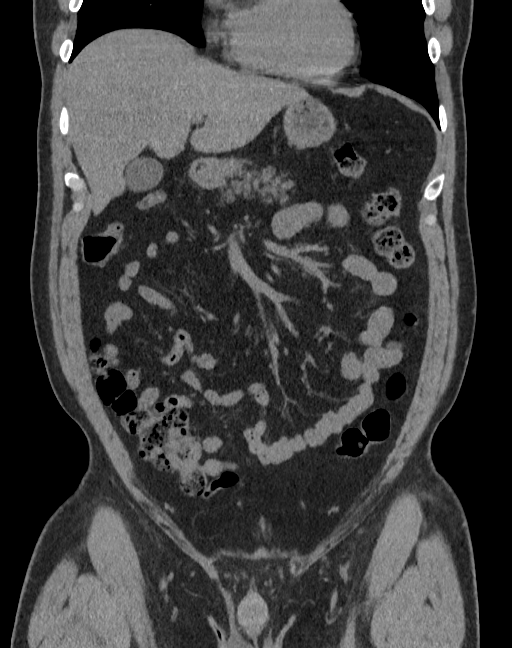
[im 61/137  soft-tissue]
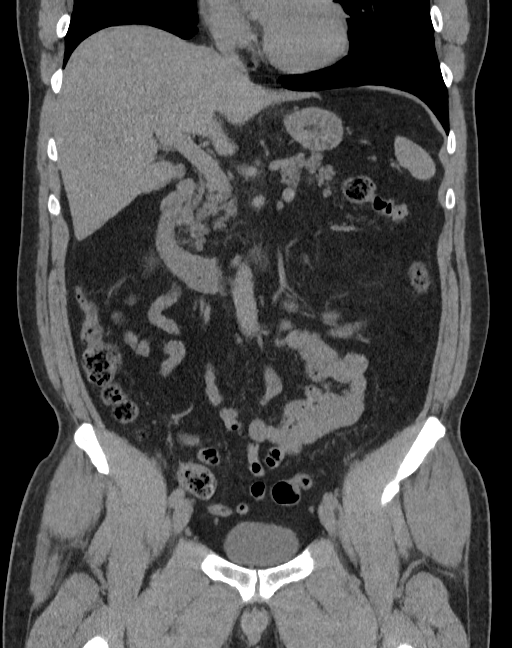
[im 76/137  soft-tissue]
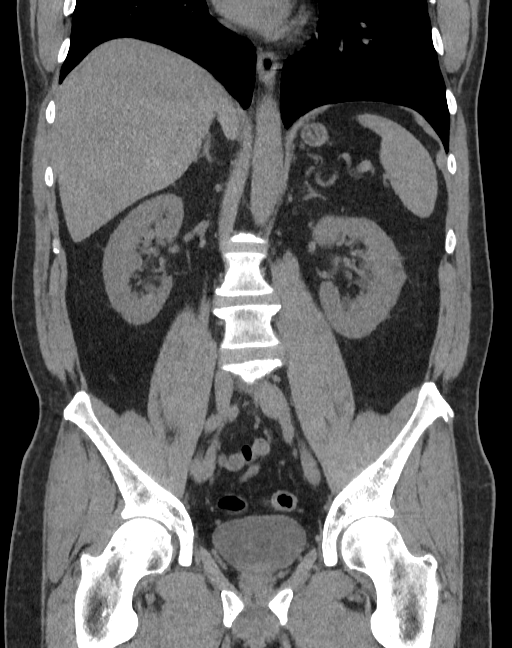

[15 of 46 positions shown; findings below may reference images not displayed]

FINDINGS: Lower chest: Clear lung bases. No significant pleural or pericardial
effusion.

Hepatobiliary: The liver appears unremarkable as imaged in the
noncontrast state. No evidence of gallstones, gallbladder wall
thickening or biliary dilatation.

Pancreas: Unremarkable. No pancreatic ductal dilatation or
surrounding inflammatory changes.

Spleen: Normal in size without focal abnormality.

Adrenals/Urinary Tract: Both adrenal glands appear normal. There are
nonobstructing renal calculi bilaterally. On the right, there are 2
stones, largest in the upper pole measuring 4 mm on image 33/2. On
the left, there are 2 stones, largest in the interpolar region
measuring 9 mm in diameter. No evidence of hydronephrosis,
perinephric soft tissue stranding, ureteral or bladder calculi. The
bladder appears unremarkable for its degree of distention.

Stomach/Bowel: Enteric contrast was not administered. The stomach
appears unremarkable for its degree of distension. No evidence of
bowel wall thickening, distention or surrounding inflammatory
change. The appendix appears normal.

Vascular/Lymphatic: There are no enlarged abdominal or pelvic lymph
nodes. No significant vascular findings on noncontrast imaging.

Reproductive: The prostate gland and seminal vesicles appear
unremarkable.

Other: No evidence of abdominal wall mass or hernia. No ascites.

Musculoskeletal: No acute or significant osseous findings. Mild
lumbar spondylosis.
IMPRESSION: 1. Nonobstructing bilateral renal calculi as described. No evidence
of ureteral calculus or hydronephrosis.
2. No acute abdominal findings.
3. Mild lumbar spondylosis.

## 2022-12-10 ENCOUNTER — Other Ambulatory Visit: Payer: Self-pay | Admitting: Internal Medicine

## 2022-12-10 DIAGNOSIS — Z1212 Encounter for screening for malignant neoplasm of rectum: Secondary | ICD-10-CM

## 2022-12-10 DIAGNOSIS — Z1211 Encounter for screening for malignant neoplasm of colon: Secondary | ICD-10-CM

## 2022-12-21 ENCOUNTER — Ambulatory Visit (INDEPENDENT_AMBULATORY_CARE_PROVIDER_SITE_OTHER): Payer: 59 | Admitting: Internal Medicine

## 2022-12-21 ENCOUNTER — Encounter: Payer: Self-pay | Admitting: Internal Medicine

## 2022-12-21 VITALS — BP 132/88 | HR 77 | Ht 67.0 in | Wt 174.0 lb

## 2022-12-21 DIAGNOSIS — R739 Hyperglycemia, unspecified: Secondary | ICD-10-CM

## 2022-12-21 DIAGNOSIS — Z0001 Encounter for general adult medical examination with abnormal findings: Secondary | ICD-10-CM

## 2022-12-21 DIAGNOSIS — E663 Overweight: Secondary | ICD-10-CM

## 2022-12-21 DIAGNOSIS — Z6827 Body mass index (BMI) 27.0-27.9, adult: Secondary | ICD-10-CM

## 2022-12-21 DIAGNOSIS — Z125 Encounter for screening for malignant neoplasm of prostate: Secondary | ICD-10-CM

## 2022-12-21 DIAGNOSIS — Z136 Encounter for screening for cardiovascular disorders: Secondary | ICD-10-CM

## 2022-12-21 NOTE — Progress Notes (Unsigned)
Subjective:    Patient ID: Jeremy Johns, male    DOB: July 01, 1960, 62 y.o.   MRN: [id number]  HPI  Patient presents to the clinic today for his annual exam.  Flu: never Tetanus: 11/2017 COVID: never Shingrix: never PSA screening: 10/2021 Colon screening: never Vision screening: annually Dentist: biannually  Diet: He does eat meat. He consumes fruits and veggies. He tries to avoid fried foods. He drinks mostly water, gatorade, soda Exercise: None  Review of Systems     Past Medical History:  Diagnosis Date   Allergic rhinitis    HTN (hypertension)    LBP (low back pain)    Osteoarthritis     Current Outpatient Medications  Medication Sig Dispense Refill   Cholecalciferol (VITAMIN D3) 50 MCG (2000 UT) TABS Take by mouth.     losartan (COZAAR) 50 MG tablet TAKE 1 AND 1/2 TABLETS DAILY BY MOUTH 135 tablet 1   Multiple Vitamin (MULTIVITAMIN) tablet Take 1 tablet by mouth daily.     No current facility-administered medications for this visit.    No Known Allergies  Family History  Problem Relation Age of Onset   Diabetes Mother    Hypertension Mother    Diabetes Father    Hypertension Father    Hypertension Unknown    Diabetes Unknown     Social History   Socioeconomic History   Marital status: Married    Spouse name: Not on file   Number of children: Not on file   Years of education: Not on file   Highest education level: 12th grade  Occupational History   Not on file  Tobacco Use   Smoking status: Never   Smokeless tobacco: Never  Substance and Sexual Activity   Alcohol use: No   Drug use: No   Sexual activity: Yes  Other Topics Concern   Not on file  Social History Narrative   Not on file   Social Determinants of Health   Financial Resource Strain: Low Risk  (06/18/2022)   Overall Financial Resource Strain (CARDIA)    Difficulty of Paying Living Expenses: Not hard at all  Food Insecurity: No Food Insecurity (06/18/2022)   Hunger Vital Sign     Worried About Running Out of Food in the Last Year: Never true    Ran Out of Food in the Last Year: Never true  Transportation Needs: No Transportation Needs (06/18/2022)   PRAPARE - Administrator, Civil Service (Medical): No    Lack of Transportation (Non-Medical): No  Physical Activity: Sufficiently Active (06/18/2022)   Exercise Vital Sign    Days of Exercise per Week: 5 days    Minutes of Exercise per Session: 30 min  Stress: No Stress Concern Present (06/18/2022)   [employer] of Occupational Health - Occupational Stress Questionnaire    Feeling of Stress : Not at all  Social Connections: Unknown (06/18/2022)   Social Connection and Isolation Panel [NHANES]    Frequency of Communication with Friends and Family: Not on file    Frequency of Social Gatherings with Friends and Family: Not on file    Attends Religious Services: Not on file    Active Member of Clubs or Organizations: No    Attends Banker Meetings: Not on file    Marital Status: Not on file  Intimate Partner Violence: Not on file     Constitutional: Denies fever, malaise, fatigue, headache or abrupt weight changes.  HEENT: Denies eye pain, eye redness, ear  pain, ringing in the ears, wax buildup, runny nose, nasal congestion, bloody nose, or sore throat. Respiratory: Denies difficulty breathing, shortness of breath, cough or sputum production.   Cardiovascular: Denies chest pain, chest tightness, palpitations or swelling in the hands or feet.  Gastrointestinal: Denies abdominal pain, bloating, constipation, diarrhea or blood in the stool.  GU: Denies urgency, frequency, pain with urination, burning sensation, blood in urine, odor or discharge. Musculoskeletal: Patient reports joint pain.  Denies decrease in range of motion, difficulty with gait, muscle pain or joint swelling.  Skin: Denies redness, rashes, lesions or ulcercations.  Neurological: Denies dizziness, difficulty with memory,  difficulty with speech or problems with balance and coordination.  Psych: Denies anxiety, depression, SI/HI.  No other specific complaints in a complete review of systems (except as listed in HPI above).  Objective:   Physical Exam   There were no vitals taken for this visit. Wt Readings from Last 3 Encounters:  06/27/22 175 lb (79.4 kg)  06/21/22 173 lb (78.5 kg)  06/03/22 175 lb (79.4 kg)    General: Appears their stated age, well developed, well nourished in NAD. Skin: Warm, dry and intact. No rashes, lesions or ulcerations noted. HEENT: Head: normal shape and size; Eyes: sclera white, no icterus, conjunctiva pink, PERRLA and EOMs intact; Ears: Tm's gray and intact, normal light reflex; Nose: mucosa pink and moist, septum midline; Throat/Mouth: Teeth present, mucosa pink and moist, no exudate, lesions or ulcerations noted.  Neck:  Neck supple, trachea midline. No masses, lumps or thyromegaly present.  Cardiovascular: Normal rate and rhythm. S1,S2 noted.  No murmur, rubs or gallops noted. No JVD or BLE edema. No carotid bruits noted. Pulmonary/Chest: Normal effort and positive vesicular breath sounds. No respiratory distress. No wheezes, rales or ronchi noted.  Abdomen: Soft and nontender. Normal bowel sounds. No distention or masses noted. Liver, spleen and kidneys non palpable. Musculoskeletal: Normal range of motion. No signs of joint swelling. No difficulty with gait.  Neurological: Alert and oriented. Cranial nerves II-XII grossly intact. Coordination normal.  Psychiatric: Mood and affect normal. Behavior is normal. Judgment and thought content normal.     BMET    Component Value Date/Time   NA 139 05/17/2022 1621   K 3.9 05/17/2022 1621   CL 107 05/17/2022 1621   CO2 24 05/17/2022 1621   GLUCOSE 87 05/17/2022 1621   BUN 18 05/17/2022 1621   CREATININE 0.84 05/17/2022 1621   CREATININE 0.95 10/13/2021 1537   CALCIUM 9.3 05/17/2022 1621   GFRNONAA >60 05/17/2022 1621    GFRAA 104 07/11/2007 1017    Lipid Panel     Component Value Date/Time   CHOL 185 10/13/2021 1537   TRIG 133 10/13/2021 1537   HDL 48 10/13/2021 1537   CHOLHDL 3.9 10/13/2021 1537   VLDL 16.4 05/20/2020 0839   LDLCALC 112 (H) 10/13/2021 1537    CBC    Component Value Date/Time   WBC 8.8 10/13/2021 1537   RBC 5.45 10/13/2021 1537   HGB 16.5 10/13/2021 1537   HCT 48.6 10/13/2021 1537   PLT 295 10/13/2021 1537   MCV 89.2 10/13/2021 1537   MCH 30.3 10/13/2021 1537   MCHC 34.0 10/13/2021 1537   RDW 12.5 10/13/2021 1537   LYMPHSABS 2.5 01/10/2012 0841   MONOABS 0.5 01/10/2012 0841   EOSABS 0.1 01/10/2012 0841   BASOSABS 0.0 01/10/2012 0841    Hgb A1C Lab Results  Component Value Date   HGBA1C 5.4 10/13/2021  Assessment & Plan:   Preventative health maintenance:  Flu Tetanus UTD Encouraged him to get his COVID-vaccine Discussed Shingrix vaccine, he will check coverage with his insurance company and schedule visit if he would like to have this done Cologuard previously ordered, advised him to complete this Encouraged him to consume a balanced diet and exercise regimen Advised him to see an eye doctor and dentist annually We will check CBC, c-Met, lipid, A1c and PSA today  RTC in 6 months, follow-up chronic conditions Nicki Reaper, NP

## 2022-12-22 ENCOUNTER — Encounter: Payer: Self-pay | Admitting: Internal Medicine

## 2022-12-22 LAB — COMPLETE METABOLIC PANEL WITHOUT GFR
AG Ratio: 1.7 (calc) (ref 1.0–2.5)
ALT: 22 U/L (ref 9–46)
AST: 11 U/L (ref 10–35)
Albumin: 4.5 g/dL (ref 3.6–5.1)
Alkaline phosphatase (APISO): 67 U/L (ref 35–144)
BUN: 24 mg/dL (ref 7–25)
CO2: 28 mmol/L (ref 20–32)
Calcium: 9.8 mg/dL (ref 8.6–10.3)
Chloride: 104 mmol/L (ref 98–110)
Creat: 0.97 mg/dL (ref 0.70–1.35)
Globulin: 2.7 g/dL (ref 1.9–3.7)
Glucose, Bld: 96 mg/dL (ref 65–99)
Potassium: 4.7 mmol/L (ref 3.5–5.3)
Sodium: 140 mmol/L (ref 135–146)
Total Bilirubin: 0.5 mg/dL (ref 0.2–1.2)
Total Protein: 7.2 g/dL (ref 6.1–8.1)
eGFR: 89 mL/min/1.73m2

## 2022-12-22 LAB — LIPID PANEL
Cholesterol: 178 mg/dL
HDL: 48 mg/dL
LDL Cholesterol (Calc): 110 mg/dL — ABNORMAL HIGH
Non-HDL Cholesterol (Calc): 130 mg/dL — ABNORMAL HIGH
Total CHOL/HDL Ratio: 3.7 (calc)
Triglycerides: 96 mg/dL

## 2022-12-22 LAB — CBC
HCT: 49.5 % (ref 38.5–50.0)
Hemoglobin: 16.4 g/dL (ref 13.2–17.1)
MCH: 30.1 pg (ref 27.0–33.0)
MCHC: 33.1 g/dL (ref 32.0–36.0)
MCV: 90.8 fL (ref 80.0–100.0)
MPV: 10.5 fL (ref 7.5–12.5)
Platelets: 292 Thousand/uL (ref 140–400)
RBC: 5.45 Million/uL (ref 4.20–5.80)
RDW: 12.4 % (ref 11.0–15.0)
WBC: 11.7 Thousand/uL — ABNORMAL HIGH (ref 3.8–10.8)

## 2022-12-22 LAB — PSA: PSA: 2.71 ng/mL

## 2022-12-22 LAB — HEMOGLOBIN A1C
Hgb A1c MFr Bld: 5.8 %{Hb} — ABNORMAL HIGH (ref <5.7)
Mean Plasma Glucose: 120 mg/dL
eAG (mmol/L): 6.6 mmol/L

## 2022-12-22 MED ORDER — LOSARTAN POTASSIUM 50 MG PO TABS: ORAL_TABLET | ORAL | 1 refills | Status: DC

## 2022-12-22 NOTE — Patient Instructions (Signed)
Health Maintenance, Male Adopting a healthy lifestyle and getting preventive care are important in promoting health and wellness. Ask your health care provider about: The right schedule for you to have regular tests and exams. Things you can do on your own to prevent diseases and keep yourself healthy. What should I know about diet, weight, and exercise? Eat a healthy diet  Eat a diet that includes plenty of vegetables, fruits, low-fat dairy products, and lean protein. Do not eat a lot of foods that are high in solid fats, added sugars, or sodium. Maintain a healthy weight Body mass index (BMI) is a measurement that can be used to identify possible weight problems. It estimates body fat based on height and weight. Your health care provider can help determine your BMI and help you achieve or maintain a healthy weight. Get regular exercise Get regular exercise. This is one of the most important things you can do for your health. Most adults should: Exercise for at least 150 minutes each week. The exercise should increase your heart rate and make you sweat (moderate-intensity exercise). Do strengthening exercises at least twice a week. This is in addition to the moderate-intensity exercise. Spend less time sitting. Even light physical activity can be beneficial. Watch cholesterol and blood lipids Have your blood tested for lipids and cholesterol at 61 years of age, then have this test every 5 years. You may need to have your cholesterol levels checked more often if: Your lipid or cholesterol levels are high. You are older than 61 years of age. You are at high risk for heart disease. What should I know about cancer screening? Many types of cancers can be detected early and may often be prevented. Depending on your health history and family history, you may need to have cancer screening at various ages. This may include screening for: Colorectal cancer. Prostate cancer. Skin cancer. Lung  cancer. What should I know about heart disease, diabetes, and high blood pressure? Blood pressure and heart disease High blood pressure causes heart disease and increases the risk of stroke. This is more likely to develop in people who have high blood pressure readings or are overweight. Talk with your health care provider about your target blood pressure readings. Have your blood pressure checked: Every 3-5 years if you are 18-39 years of age. Every year if you are 40 years old or older. If you are between the ages of 65 and 75 and are a current or former smoker, ask your health care provider if you should have a one-time screening for abdominal aortic aneurysm (AAA). Diabetes Have regular diabetes screenings. This checks your fasting blood sugar level. Have the screening done: Once every three years after age 45 if you are at a normal weight and have a low risk for diabetes. More often and at a younger age if you are overweight or have a high risk for diabetes. What should I know about preventing infection? Hepatitis B If you have a higher risk for hepatitis B, you should be screened for this virus. Talk with your health care provider to find out if you are at risk for hepatitis B infection. Hepatitis C Blood testing is recommended for: Everyone born from 1945 through 1965. Anyone with known risk factors for hepatitis C. Sexually transmitted infections (STIs) You should be screened each year for STIs, including gonorrhea and chlamydia, if: You are sexually active and are younger than 61 years of age. You are older than 61 years of age and your   health care provider tells you that you are at risk for this type of infection. Your sexual activity has changed since you were last screened, and you are at increased risk for chlamydia or gonorrhea. Ask your health care provider if you are at risk. Ask your health care provider about whether you are at high risk for HIV. Your health care provider  may recommend a prescription medicine to help prevent HIV infection. If you choose to take medicine to prevent HIV, you should first get tested for HIV. You should then be tested every 3 months for as long as you are taking the medicine. Follow these instructions at home: Alcohol use Do not drink alcohol if your health care provider tells you not to drink. If you drink alcohol: Limit how much you have to 0-2 drinks a day. Know how much alcohol is in your drink. In the [place] one drink equals one 12 oz bottle of beer (355 mL), one 5 oz glass of wine (148 mL), or one 1 oz glass of hard liquor (44 mL). Lifestyle Do not use any products that contain nicotine or tobacco. These products include cigarettes, chewing tobacco, and vaping devices, such as e-cigarettes. If you need help quitting, ask your health care provider. Do not use street drugs. Do not share needles. Ask your health care provider for help if you need support or information about quitting drugs. General instructions Schedule regular health, dental, and eye exams. Stay current with your vaccines. Tell your health care provider if: You often feel depressed. You have ever been abused or do not feel safe at home. Summary Adopting a healthy lifestyle and getting preventive care are important in promoting health and wellness. Follow your health care provider's instructions about healthy diet, exercising, and getting tested or screened for diseases. Follow your health care provider's instructions on monitoring your cholesterol and blood pressure. This information is not intended to replace advice given to you by your health care provider. Make sure you discuss any questions you have with your health care provider. Document Revised: 07/14/2020 Document Reviewed: 07/14/2020 Elsevier Patient Education  2024 Elsevier Inc.  

## 2022-12-22 NOTE — Assessment & Plan Note (Signed)
Encouraged diet and exercise for weight loss ?

## 2023-02-01 ENCOUNTER — Other Ambulatory Visit: Payer: Self-pay | Admitting: Podiatry

## 2023-02-28 ENCOUNTER — Other Ambulatory Visit: Payer: Self-pay | Admitting: Podiatry

## 2023-03-21 ENCOUNTER — Encounter: Payer: Self-pay | Admitting: Internal Medicine

## 2023-03-23 ENCOUNTER — Other Ambulatory Visit: Payer: Self-pay | Admitting: Urology

## 2023-03-23 DIAGNOSIS — N200 Calculus of kidney: Secondary | ICD-10-CM

## 2023-03-23 NOTE — Progress Notes (Signed)
03/24/2023 9:51 AM   Jeremy Johns 07/25/1960 [id number]  Referring provider: Lorre Munroe, NP [address] [place],  [place] [address]  Urological history: 1.  Nephrolithiasis -History of stone disease since 2018 - left urs (05/2022)  -right ESWL (05/2022)   2. BPH with LU TS -PSA (12/2022) 2.71  Chief Complaint  Patient presents with   Nephrolithiasis   HPI: Jeremy Johns is a 63 y.o. male who presents today for possible stones.   Previous records reviewed.   UA unremarkable  KUB 3 mm stone in the midpole of the right kidney and a 5 mm stone in the lower pole of the left kidney  He has been having left-sided groin pain rating down into the left testicle for over a year.  He states the pain comes and goes.  He has noted that is increased in frequency over the last few weeks.  He wakes up with the pain and as the day goes on the pain abates.  He denied any swelling in the testicle.  He denied any groin bulges.  He denied any pain with ejaculation or hematospermia.  He has noted a weak urinary stream over the last few months as well.  Patient denies any modifying or aggravating factors.  Patient denies any recent UTI's, gross hematuria, dysuria or suprapubic/flank pain.  Patient denies any fevers, chills, nausea or vomiting.    PMH: Past Medical History:  Diagnosis Date   Allergic rhinitis    HTN (hypertension)    LBP (low back pain)    Osteoarthritis     Surgical History: Past Surgical History:  Procedure Laterality Date   CYSTOSCOPY WITH STENT PLACEMENT Right 05/18/2022   Procedure: CYSTOSCOPY WITH STENT PLACEMENT' DIAGNOSTIC URETEROSCOPY;  Surgeon: Riki Altes, MD;  Location: [facility];  Service: Urology;  Laterality: Right;   CYSTOSCOPY/URETEROSCOPY/HOLMIUM LASER/STENT PLACEMENT Left 05/18/2022   Procedure: CYSTOSCOPY/URETEROSCOPY/HOLMIUM LASER/STENT PLACEMENT;  Surgeon: Riki Altes, MD;  Location: [facility];  Service: Urology;  Laterality: Left;    EXTRACORPOREAL SHOCK WAVE LITHOTRIPSY Right 05/20/2022   Procedure: EXTRACORPOREAL SHOCK WAVE LITHOTRIPSY (ESWL);  Surgeon: Vanna Scotland, MD;  Location: [facility];  Service: Urology;  Laterality: Right;   EYE SURGERY Bilateral 2012   R cataract    Home Medications:  Allergies as of 03/24/2023   No Known Allergies      Medication List        Accurate as of March 24, 2023  9:51 AM. If you have any questions, ask your nurse or doctor.          losartan 50 MG tablet Commonly known as: COZAAR TAKE 1 AND 1/2 TABLETS DAILY BY MOUTH   meloxicam 15 MG tablet Commonly known as: MOBIC Take 1 tablet (15 mg total) by mouth daily.   multivitamin tablet Take 1 tablet by mouth daily.   tamsulosin 0.4 MG Caps capsule Commonly known as: FLOMAX Take 1 capsule (0.4 mg total) by mouth daily. Started by: Michiel Cowboy   Vitamin D3 50 MCG (2000 UT) Tabs Take by mouth.        Allergies: No Known Allergies  Family History: Family History  Problem Relation Age of Onset   Diabetes Mother    Hypertension Mother    Diabetes Father    Hypertension Father    Hypertension Unknown    Diabetes Unknown     Social History:  reports that he has never smoked. He has never used smokeless tobacco. He reports that he  does not drink alcohol and does not use drugs.  ROS: Pertinent ROS in HPI  Physical Exam: BP (!) 146/96   Pulse 86   Ht 5\' 7"  (1.702 m)   Wt 177 lb (80.3 kg)   BMI 27.72 kg/m   Constitutional:  Well nourished. Alert and oriented, No acute distress. HEENT: [place] AT, moist mucus membranes.  Trachea midline, no masses. Cardiovascular: No clubbing, cyanosis, or edema. Respiratory: Normal respiratory effort, no increased work of breathing. GU: No CVA tenderness.  No bladder fullness or masses.  Patient with circumcised phallus.  Urethral meatus is patent.  No penile discharge. No penile lesions or rashes. Scrotum without lesions, cysts, rashes and/or edema.  Testicles are  located scrotally bilaterally. No masses are appreciated in the testicles. Left and right epididymis are normal. Neurologic: Grossly intact, no focal deficits, moving all 4 extremities. Psychiatric: Normal mood and affect.  Laboratory Data: Lab Results  Component Value Date   WBC 11.7 (H) 12/21/2022   HGB 16.4 12/21/2022   HCT 49.5 12/21/2022   MCV 90.8 12/21/2022   PLT 292 12/21/2022    Lab Results  Component Value Date   CREATININE 0.97 12/21/2022    Lab Results  Component Value Date   PSA 2.71 12/21/2022   PSA 2.80 10/13/2021   PSA 4.61 (H) 05/20/2020    Lab Results  Component Value Date   TESTOSTERONE 278.08 (L) 12/01/2017    Lab Results  Component Value Date   HGBA1C 5.8 (H) 12/21/2022    Lab Results  Component Value Date   TSH 1.24 01/10/2012       Component Value Date/Time   CHOL 178 12/21/2022 1542   HDL 48 12/21/2022 1542   CHOLHDL 3.7 12/21/2022 1542   VLDL 16.4 05/20/2020 0839   LDLCALC 110 (H) 12/21/2022 1542    Lab Results  Component Value Date   AST 11 12/21/2022   Lab Results  Component Value Date   ALT 22 12/21/2022   Urinalysis See EPIC and HPI  I have reviewed the labs.   Pertinent Imaging: KUB, 3 mm stone in the midpole of the right kidney and a 5 mm stone in the lower pole of the left kidney, radiologist interpretation still pending  I have independently reviewed the films.    Assessment & Plan:    1.  Nephrolithiasis -UA negative  -KUB bilateral nephrolithiasis -We had ordered a metabolic work on him at his last office visit, but he states he never received the test kit nor a phone call -I went ahead and reordered the Lithol[facility] for him and asked him to contact me via MyChart if he does not receive a phone call or the kit in a week -I will contact him when we receive the results  2. BPH with LU TS -He is having more experience with a weak urinary stream, we will go ahead and have him restart tamsulosin 0.4 mg daily -He  was taking them before to help with stent discomfort and passage of ureteral fragments and tolerated the medication  Return for pending Litholink results .  These notes generated with voice recognition software. I apologize for typographical errors.  Cloretta Ned  [facility] [address]  [address] [place], [place] [address] [phone]

## 2023-03-24 ENCOUNTER — Encounter: Payer: Self-pay | Admitting: Urology

## 2023-03-24 ENCOUNTER — Ambulatory Visit
Admission: RE | Admit: 2023-03-24 | Discharge: 2023-03-24 | Disposition: A | Discharge status: Home / Self Care | Payer: 59 | Source: Ambulatory Visit | Attending: Urology | Admitting: Urology

## 2023-03-24 ENCOUNTER — Ambulatory Visit (INDEPENDENT_AMBULATORY_CARE_PROVIDER_SITE_OTHER): Payer: 59 | Admitting: Urology

## 2023-03-24 ENCOUNTER — Ambulatory Visit
Admission: RE | Admit: 2023-03-24 | Discharge: 2023-03-24 | Disposition: A | Discharge status: Home / Self Care | Payer: 59 | Attending: Urology | Admitting: Urology

## 2023-03-24 VITALS — BP 146/96 | HR 86 | Ht 67.0 in | Wt 177.0 lb

## 2023-03-24 DIAGNOSIS — N200 Calculus of kidney: Secondary | ICD-10-CM | POA: Diagnosis present

## 2023-03-24 DIAGNOSIS — R3912 Poor urinary stream: Secondary | ICD-10-CM | POA: Diagnosis not present

## 2023-03-24 DIAGNOSIS — N401 Enlarged prostate with lower urinary tract symptoms: Secondary | ICD-10-CM | POA: Diagnosis not present

## 2023-03-24 LAB — MICROSCOPIC EXAMINATION

## 2023-03-24 LAB — URINALYSIS, COMPLETE
Bilirubin, UA: NEGATIVE
Ketones, UA: NEGATIVE
Leukocytes,UA: NEGATIVE
Nitrite, UA: NEGATIVE
Protein,UA: NEGATIVE
RBC, UA: NEGATIVE
Specific Gravity, UA: >1.03 — ABNORMAL HIGH (ref 1.005–1.030)
Urobilinogen, Ur: 0.2 mg/dL (ref 0.2–1.0)
pH, UA: 6 (ref 5.0–7.5)

## 2023-03-24 MED ORDER — TAMSULOSIN HCL 0.4 MG PO CAPS: 0.4000 mg | ORAL_CAPSULE | Freq: Every day | ORAL | 3 refills | Status: DC

## 2023-03-24 NOTE — Patient Instructions (Signed)

## 2023-03-27 LAB — CULTURE, URINE COMPREHENSIVE

## 2023-03-29 ENCOUNTER — Other Ambulatory Visit: Payer: Self-pay | Admitting: Urology

## 2023-03-29 DIAGNOSIS — N5082 Scrotal pain: Secondary | ICD-10-CM

## 2023-03-29 NOTE — Addendum Note (Signed)
Addended by: Michiel Cowboy A on: 03/29/2023 04:11 PM   Modules accepted: Orders

## 2023-03-30 ENCOUNTER — Ambulatory Visit: Payer: Self-pay | Admitting: Urology

## 2023-04-03 ENCOUNTER — Other Ambulatory Visit: Payer: Self-pay | Admitting: Podiatry

## 2023-05-19 ENCOUNTER — Encounter: Payer: Self-pay | Admitting: Internal Medicine

## 2023-05-19 ENCOUNTER — Ambulatory Visit: Admitting: Internal Medicine

## 2023-05-19 VITALS — BP 138/78 | HR 85 | Ht 67.0 in | Wt 174.4 lb

## 2023-05-19 DIAGNOSIS — J0110 Acute frontal sinusitis, unspecified: Secondary | ICD-10-CM | POA: Diagnosis not present

## 2023-05-19 MED ORDER — PREDNISONE 10 MG PO TABS: ORAL_TABLET | ORAL | 0 refills | Status: DC

## 2023-05-19 MED ORDER — AMOXICILLIN-POT CLAVULANATE 875-125 MG PO TABS: 1.0000 | ORAL_TABLET | Freq: Two times a day (BID) | ORAL | 0 refills | Status: DC

## 2023-05-19 NOTE — Patient Instructions (Signed)

## 2023-05-19 NOTE — Progress Notes (Signed)
 Subjective:    Patient ID: Jeremy Johns, male    DOB: August 20, 1960, 63 y.o.   MRN: [id number]  HPI  Discussed the use of AI scribe software for clinical note transcription with the patient, who gave verbal consent to proceed.  The patient presents with worsening allergy symptoms and sinus congestion.  He has been experiencing worsening sinus congestion and pressure since the past weekend. The congestion is described as 'congestive, not really runny.' Initially, he noticed a headache and sinus pressure, which have persisted and progressively worsened.  He has a scratchy throat with slight discomfort on the left side but no significant ear pain. No nausea, vomiting, diarrhea, fever, chills, or body aches. He checked his temperature this morning due to chills but did not have a fever.  Coughing is painful with some production, but there is no shortness of breath or chest pain except when coughing. No significant sore throat.  He has been taking over-the-counter medications, specifically tylenol, to manage his symptoms. Several coworkers have similar symptoms, suggesting a possible viral or allergic component. He does not typically experience severe allergies during this time of year, although he has had allergies in the past, which he believes he has mostly outgrown.       Review of Systems   Past Medical History:  Diagnosis Date   Allergic rhinitis    HTN (hypertension)    LBP (low back pain)    Osteoarthritis     Current Outpatient Medications  Medication Sig Dispense Refill   Cholecalciferol (VITAMIN D3) 50 MCG (2000 UT) TABS Take by mouth.     losartan (COZAAR) 50 MG tablet TAKE 1 AND 1/2 TABLETS DAILY BY MOUTH 135 tablet 1   meloxicam (MOBIC) 15 MG tablet TAKE 1 TABLET (15 MG TOTAL) BY MOUTH DAILY. 30 tablet 0   Multiple Vitamin (MULTIVITAMIN) tablet Take 1 tablet by mouth daily.     tamsulosin (FLOMAX) 0.4 MG CAPS capsule Take 1 capsule (0.4 mg total) by mouth daily. 90  capsule 3   No current facility-administered medications for this visit.    No Known Allergies  Family History  Problem Relation Age of Onset   Diabetes Mother    Hypertension Mother    Diabetes Father    Hypertension Father    Hypertension Unknown    Diabetes Unknown     Social History   Socioeconomic History   Marital status: Married    Spouse name: Not on file   Number of children: Not on file   Years of education: Not on file   Highest education level: 12th grade  Occupational History   Not on file  Tobacco Use   Smoking status: Never   Smokeless tobacco: Never  Substance and Sexual Activity   Alcohol use: No   Drug use: No   Sexual activity: Yes  Other Topics Concern   Not on file  Social History Narrative   Not on file   Social Drivers of Health   Financial Resource Strain: Low Risk  (06/18/2022)   Overall Financial Resource Strain (CARDIA)    Difficulty of Paying Living Expenses: Not hard at all  Food Insecurity: No Food Insecurity (06/18/2022)   Hunger Vital Sign    Worried About Running Out of Food in the Last Year: Never true    Ran Out of Food in the Last Year: Never true  Transportation Needs: No Transportation Needs (06/18/2022)   PRAPARE - Administrator, Civil Service (Medical): No  Lack of Transportation (Non-Medical): No  Physical Activity: Sufficiently Active (06/18/2022)   Exercise Vital Sign    Days of Exercise per Week: 5 days    Minutes of Exercise per Session: 30 min  Stress: No Stress Concern Present (06/18/2022)   [employer] of Occupational Health - Occupational Stress Questionnaire    Feeling of Stress : Not at all  Social Connections: Unknown (06/18/2022)   Social Connection and Isolation Panel [NHANES]    Frequency of Communication with Friends and Family: Not on file    Frequency of Social Gatherings with Friends and Family: Not on file    Attends Religious Services: Not on file    Active Member of Clubs or  Organizations: No    Attends Banker Meetings: Not on file    Marital Status: Not on file  Intimate Partner Violence: Not on file     Constitutional: Pt reports headache. Denies fever, malaise, fatigue, or abrupt weight changes.  HEENT: Pt reports sinus pressure,  ear pain, nasal congestion and sore throat. Denies eye pain, eye redness, ringing in the ears, wax buildup, runny nose, bloody nose. Respiratory: Pt reports cough and shortness of breath. Denies difficulty breathing.   Cardiovascular: Pt reports chest tightness. Denies chest pain, palpitations or swelling in the hands or feet.  Gastrointestinal: Denies abdominal pain, bloating, constipation, diarrhea or blood in the stool.  Musculoskeletal: Denies decrease in range of motion, difficulty with gait, muscle pain or joint pain and swelling.  Neurological: Denies dizziness, difficulty with memory, difficulty with speech or problems with balance and coordination.    No other specific complaints in a complete review of systems (except as listed in HPI above).      Objective:   Physical Exam  BP 138/78   Pulse 85   Ht 5\' 7"  (1.702 m)   Wt 174 lb 6.4 oz (79.1 kg)   SpO2 99%   BMI 27.31 kg/m   Wt Readings from Last 3 Encounters:  03/24/23 177 lb (80.3 kg)  12/21/22 174 lb (78.9 kg)  06/27/22 175 lb (79.4 kg)    General: Appears his stated age, appears unwell but in NAD. Skin: Warm, dry and intact.  HEENT: Head: normal shape and size, frontal sinus tenderness noted; Eyes: sclera white, no icterus, conjunctiva pink, PERRLA and EOMs intact; Ears: Tm's gray and intact, normal light reflex; Nose: mucosa pink and moist, septum midline; Throat/Mouth: Teeth present, mucosa erythema this and moist, + PND, no exudate, lesions or ulcerations noted.  Neck: No adenopathy noted. Cardiovascular: Normal rate and rhythm.  Pulmonary/Chest: Normal effort and positive vesicular breath sounds. No respiratory distress. No wheezes,  rales or ronchi noted.  Neurological: Alert and oriented.   BMET    Component Value Date/Time   NA 140 12/21/2022 1542   K 4.7 12/21/2022 1542   CL 104 12/21/2022 1542   CO2 28 12/21/2022 1542   GLUCOSE 96 12/21/2022 1542   BUN 24 12/21/2022 1542   CREATININE 0.97 12/21/2022 1542   CALCIUM 9.8 12/21/2022 1542   GFRNONAA >60 05/17/2022 1621   GFRAA 104 07/11/2007 1017    Lipid Panel     Component Value Date/Time   CHOL 178 12/21/2022 1542   TRIG 96 12/21/2022 1542   HDL 48 12/21/2022 1542   CHOLHDL 3.7 12/21/2022 1542   VLDL 16.4 05/20/2020 0839   LDLCALC 110 (H) 12/21/2022 1542    CBC    Component Value Date/Time   WBC 11.7 (H) 12/21/2022 1542  RBC 5.45 12/21/2022 1542   HGB 16.4 12/21/2022 1542   HCT 49.5 12/21/2022 1542   PLT 292 12/21/2022 1542   MCV 90.8 12/21/2022 1542   MCH 30.1 12/21/2022 1542   MCHC 33.1 12/21/2022 1542   RDW 12.4 12/21/2022 1542   LYMPHSABS 2.5 01/10/2012 0841   MONOABS 0.5 01/10/2012 0841   EOSABS 0.1 01/10/2012 0841   BASOSABS 0.0 01/10/2012 0841    Hgb A1C Lab Results  Component Value Date   HGBA1C 5.8 (H) 12/21/2022            Assessment & Plan:  Assessment and Plan    Acute bacterial sinusitis Symptoms suggest bacterial sinusitis. Recommended treatment to prevent complications. - Prescribed Augmentin 875 mg/125 mg BID for 10 days. - Initiated 6-day steroid taper. - Advised antihistamine use due to upcoming pollen season.      RTC in 3 weeks for follow-up of chronic conditions Nicki Reaper, NP

## 2023-06-14 ENCOUNTER — Encounter: Payer: Self-pay | Admitting: Internal Medicine

## 2023-06-14 ENCOUNTER — Ambulatory Visit: Payer: Self-pay | Admitting: Internal Medicine

## 2023-06-14 VITALS — BP 130/82 | Ht 67.0 in | Wt 174.2 lb

## 2023-06-14 DIAGNOSIS — R7303 Prediabetes: Secondary | ICD-10-CM | POA: Diagnosis not present

## 2023-06-14 DIAGNOSIS — E7800 Pure hypercholesterolemia, unspecified: Secondary | ICD-10-CM

## 2023-06-14 DIAGNOSIS — M47816 Spondylosis without myelopathy or radiculopathy, lumbar region: Secondary | ICD-10-CM

## 2023-06-14 DIAGNOSIS — Z6827 Body mass index (BMI) 27.0-27.9, adult: Secondary | ICD-10-CM

## 2023-06-14 DIAGNOSIS — N200 Calculus of kidney: Secondary | ICD-10-CM

## 2023-06-14 DIAGNOSIS — I10 Essential (primary) hypertension: Secondary | ICD-10-CM | POA: Diagnosis not present

## 2023-06-14 DIAGNOSIS — E663 Overweight: Secondary | ICD-10-CM

## 2023-06-14 MED ORDER — LOSARTAN POTASSIUM 50 MG PO TABS: ORAL_TABLET | ORAL | 1 refills | Status: DC

## 2023-06-14 NOTE — Progress Notes (Unsigned)
 Subjective:    Patient ID: Jeremy Johns, male    DOB: 03-18-1960, 63 y.o.   MRN: [id number]  HPI  Patient presents to clinic today for follow-up of chronic conditions.  HTN: His BP today is 130/82.  He is taking losartan as prescribed.  ECG from 05/2022 reviewed.  OA: Mainly in his wrist and hands.  He takes ibuprofen or tylenol as needed with good relief of symptoms.  He does not follow with orthopedics.  History of kidney stones: Status post lithotripsy and stent placement/removal 05/2022.  He continues to take tamsulosin. He follows with urology.  HLD: His last LDL was 110, triglycerides 76, 12/2022.  He is not taking any cholesterol-lowering medication at this time.  He tries to consume a low-fat diet.  Prediabetes: His last A1c was 5.8%, 12/2022.  He is not taking any oral diabetic medication at this time.  He does not check his sugars.  Review of Systems     Past Medical History:  Diagnosis Date  . Allergic rhinitis   . HTN (hypertension)   . LBP (low back pain)   . Osteoarthritis     Current Outpatient Medications  Medication Sig Dispense Refill  . amoxicillin-clavulanate (AUGMENTIN) 875-125 MG tablet Take 1 tablet by mouth 2 (two) times daily. 20 tablet 0  . Cholecalciferol (VITAMIN D3) 50 MCG (2000 UT) TABS Take by mouth. (Patient not taking: Reported on 05/19/2023)    . losartan (COZAAR) 50 MG tablet TAKE 1 AND 1/2 TABLETS DAILY BY MOUTH 135 tablet 1  . meloxicam (MOBIC) 15 MG tablet TAKE 1 TABLET (15 MG TOTAL) BY MOUTH DAILY. (Patient not taking: Reported on 05/19/2023) 30 tablet 0  . Multiple Vitamin (MULTIVITAMIN) tablet Take 1 tablet by mouth daily.    . predniSONE (DELTASONE) 10 MG tablet Take 6 tabs on day 1, 5 tabs on day 2, 4 tabs on day 3, 3 tabs on day 4, 2 tabs on day 5, 1 tab on day 6 21 tablet 0  . tamsulosin (FLOMAX) 0.4 MG CAPS capsule Take 1 capsule (0.4 mg total) by mouth daily. 90 capsule 3   No current facility-administered medications for this  visit.    No Known Allergies  Family History  Problem Relation Age of Onset  . Diabetes Mother   . Hypertension Mother   . Diabetes Father   . Hypertension Father   . Hypertension Unknown   . Diabetes Unknown     Social History   Socioeconomic History  . Marital status: Married    Spouse name: Not on file  . Number of children: Not on file  . Years of education: Not on file  . Highest education level: 12th grade  Occupational History  . Not on file  Tobacco Use  . Smoking status: Never  . Smokeless tobacco: Never  Substance and Sexual Activity  . Alcohol use: No  . Drug use: No  . Sexual activity: Yes  Other Topics Concern  . Not on file  Social History Narrative  . Not on file   Social Drivers of Health   Financial Resource Strain: Low Risk  (06/13/2023)   Overall Financial Resource Strain (CARDIA)   . Difficulty of Paying Living Expenses: Not hard at all  Food Insecurity: No Food Insecurity (06/13/2023)   Hunger Vital Sign   . Worried About Programme researcher, broadcasting/film/video in the Last Year: Never true   . Ran Out of Food in the Last Year: Never true  Transportation Needs:  No Transportation Needs (06/13/2023)   PRAPARE - Transportation   . Lack of Transportation (Medical): No   . Lack of Transportation (Non-Medical): No  Physical Activity: Unknown (06/13/2023)   Exercise Vital Sign   . Days of Exercise per Week: 5 days   . Minutes of Exercise per Session: Patient declined  Stress: No Stress Concern Present (06/13/2023)   [employer] of Occupational Health - Occupational Stress Questionnaire   . Feeling of Stress : Not at all  Social Connections: Unknown (06/13/2023)   Social Connection and Isolation Panel [NHANES]   . Frequency of Communication with Friends and Family: Once a week   . Frequency of Social Gatherings with Friends and Family: Once a week   . Attends Religious Services: Patient declined   . Active Member of Clubs or Organizations: No   . Attends Tax inspector Meetings: Not on file   . Marital Status: Married  Catering manager Violence: Not on file     Constitutional: Denies fever, malaise, fatigue, headache or abrupt weight changes.  HEENT: Denies eye pain, eye redness, ear pain, ringing in the ears, wax buildup, runny nose, nasal congestion, bloody nose, or sore throat. Respiratory: Denies difficulty breathing, shortness of breath, cough or sputum production.   Cardiovascular: Denies chest pain, chest tightness, palpitations or swelling in the hands or feet.  Gastrointestinal: Denies abdominal pain, bloating, constipation, diarrhea or blood in the stool.  GU: Denies urgency, frequency, pain with urination, burning sensation, blood in urine, odor or discharge. Musculoskeletal: Patient reports joint pain.  Denies decrease in range of motion, difficulty with gait, muscle pain or joint swelling.  Skin: Denies redness, rashes, lesions or ulcercations.  Neurological: Denies dizziness, difficulty with memory, difficulty with speech or problems with balance and coordination.  Psych: Denies anxiety, depression, SI/HI.  No other specific complaints in a complete review of systems (except as listed in HPI above).  Objective:   Physical Exam  There were no vitals taken for this visit.  Wt Readings from Last 3 Encounters:  05/19/23 174 lb 6.4 oz (79.1 kg)  03/24/23 177 lb (80.3 kg)  12/21/22 174 lb (78.9 kg)    General: Appears his stated age, overweight in NAD. Skin: Warm, dry and intact.  HEENT: Head: normal shape and size; Eyes: sclera white, no icterus, conjunctiva pink, PERRLA and EOMs intact;  Neck:  Neck supple, trachea midline. No masses, lumps or thyromegaly present.  Cardiovascular: Normal rate and rhythm. S1,S2 noted.  No murmur, rubs or gallops noted. No JVD or BLE edema.  Radial pulse 2+ on the left Pulmonary/Chest: Normal effort and positive vesicular breath sounds. No respiratory distress. No wheezes, rales or ronchi  noted.  Musculoskeletal: Normal flexion, extension, rotation and lateral bending of the spine.  No bony tenderness noted over the cervical spine.  Normal internal and external rotation of the left shoulder.  No pain with palpation of the left shoulder.  Shoulder shrug equal.  Strength 5/5 BUE.  Handgrips equal.  Joint enlargement noted in hands. \ No difficulty with gait.  Neurological: Alert and oriented.  Coordination normal.     BMET    Component Value Date/Time   NA 140 12/21/2022 1542   K 4.7 12/21/2022 1542   CL 104 12/21/2022 1542   CO2 28 12/21/2022 1542   GLUCOSE 96 12/21/2022 1542   BUN 24 12/21/2022 1542   CREATININE 0.97 12/21/2022 1542   CALCIUM 9.8 12/21/2022 1542   GFRNONAA >60 05/17/2022 1621   GFRAA 104  07/11/2007 1017    Lipid Panel     Component Value Date/Time   CHOL 178 12/21/2022 1542   TRIG 96 12/21/2022 1542   HDL 48 12/21/2022 1542   CHOLHDL 3.7 12/21/2022 1542   VLDL 16.4 05/20/2020 0839   LDLCALC 110 (H) 12/21/2022 1542    CBC    Component Value Date/Time   WBC 11.7 (H) 12/21/2022 1542   RBC 5.45 12/21/2022 1542   HGB 16.4 12/21/2022 1542   HCT 49.5 12/21/2022 1542   PLT 292 12/21/2022 1542   MCV 90.8 12/21/2022 1542   MCH 30.1 12/21/2022 1542   MCHC 33.1 12/21/2022 1542   RDW 12.4 12/21/2022 1542   LYMPHSABS 2.5 01/10/2012 0841   MONOABS 0.5 01/10/2012 0841   EOSABS 0.1 01/10/2012 0841   BASOSABS 0.0 01/10/2012 0841    Hgb A1C Lab Results  Component Value Date   HGBA1C 5.8 (H) 12/21/2022           Assessment & Plan:     RTC in 6 months for your annual exam Nicki Reaper, NP

## 2023-06-15 ENCOUNTER — Encounter: Payer: Self-pay | Admitting: Internal Medicine

## 2023-06-15 DIAGNOSIS — E7800 Pure hypercholesterolemia, unspecified: Secondary | ICD-10-CM | POA: Insufficient documentation

## 2023-06-15 LAB — COMPREHENSIVE METABOLIC PANEL WITH GFR
AG Ratio: 1.9 (calc) (ref 1.0–2.5)
ALT: 21 U/L (ref 9–46)
AST: 14 U/L (ref 10–35)
Albumin: 4.7 g/dL (ref 3.6–5.1)
Alkaline phosphatase (APISO): 48 U/L (ref 35–144)
BUN: 23 mg/dL (ref 7–25)
CO2: 27 mmol/L (ref 20–32)
Calcium: 9.4 mg/dL (ref 8.6–10.3)
Chloride: 104 mmol/L (ref 98–110)
Creat: 0.94 mg/dL (ref 0.70–1.35)
Globulin: 2.5 g/dL (ref 1.9–3.7)
Glucose, Bld: 101 mg/dL (ref 65–139)
Potassium: 4.4 mmol/L (ref 3.5–5.3)
Sodium: 138 mmol/L (ref 135–146)
Total Bilirubin: 0.5 mg/dL (ref 0.2–1.2)
Total Protein: 7.2 g/dL (ref 6.1–8.1)
eGFR: 92 mL/min/{1.73_m2} (ref >=60)

## 2023-06-15 LAB — HEMOGLOBIN A1C
Hgb A1c MFr Bld: 6 %{Hb} — ABNORMAL HIGH (ref <5.7)
Mean Plasma Glucose: 126 mg/dL
eAG (mmol/L): 7 mmol/L

## 2023-06-15 LAB — CBC
HCT: 44.6 % (ref 38.5–50.0)
Hemoglobin: 15.5 g/dL (ref 13.2–17.1)
MCH: 30.6 pg (ref 27.0–33.0)
MCHC: 34.8 g/dL (ref 32.0–36.0)
MCV: 88.1 fL (ref 80.0–100.0)
MPV: 10.3 fL (ref 7.5–12.5)
Platelets: 276 10*3/uL (ref 140–400)
RBC: 5.06 10*6/uL (ref 4.20–5.80)
RDW: 12.9 % (ref 11.0–15.0)
WBC: 8.6 10*3/uL (ref 3.8–10.8)

## 2023-06-15 LAB — LIPID PANEL
Cholesterol: 177 mg/dL (ref <200)
HDL: 50 mg/dL (ref >=40)
LDL Cholesterol (Calc): 108 mg/dL — ABNORMAL HIGH
Non-HDL Cholesterol (Calc): 127 mg/dL (ref <130)
Total CHOL/HDL Ratio: 3.5 (calc) (ref <5.0)
Triglycerides: 99 mg/dL (ref <150)

## 2023-06-15 NOTE — Assessment & Plan Note (Signed)
 Controlled on losartan, refilled today CMET today

## 2023-06-15 NOTE — Assessment & Plan Note (Signed)
Okay to continue Tylenol or ibuprofen OTC as needed

## 2023-06-15 NOTE — Patient Instructions (Signed)

## 2023-06-15 NOTE — Assessment & Plan Note (Signed)
 Following with urology Continue flomax

## 2023-06-15 NOTE — Assessment & Plan Note (Signed)
 Encouraged diet and exercise for weight loss ?

## 2023-06-15 NOTE — Assessment & Plan Note (Signed)
 C-Met and lipid profile today Encouraged him to consume a low-fat diet

## 2023-06-15 NOTE — Assessment & Plan Note (Signed)
A1c today Encourage low-carb diet 

## 2023-12-22 ENCOUNTER — Encounter: Payer: Self-pay | Admitting: Internal Medicine

## 2023-12-22 ENCOUNTER — Ambulatory Visit (INDEPENDENT_AMBULATORY_CARE_PROVIDER_SITE_OTHER): Admitting: Internal Medicine

## 2023-12-22 VITALS — BP 134/82 | Ht 67.0 in | Wt 173.6 lb

## 2023-12-22 DIAGNOSIS — R7303 Prediabetes: Secondary | ICD-10-CM

## 2023-12-22 DIAGNOSIS — Z0001 Encounter for general adult medical examination with abnormal findings: Secondary | ICD-10-CM | POA: Diagnosis not present

## 2023-12-22 DIAGNOSIS — Z125 Encounter for screening for malignant neoplasm of prostate: Secondary | ICD-10-CM

## 2023-12-22 DIAGNOSIS — Z6827 Body mass index (BMI) 27.0-27.9, adult: Secondary | ICD-10-CM

## 2023-12-22 DIAGNOSIS — E7800 Pure hypercholesterolemia, unspecified: Secondary | ICD-10-CM

## 2023-12-22 DIAGNOSIS — E663 Overweight: Secondary | ICD-10-CM

## 2023-12-22 MED ORDER — LOSARTAN POTASSIUM 50 MG PO TABS: ORAL_TABLET | ORAL | 1 refills | Status: AC

## 2023-12-22 NOTE — Assessment & Plan Note (Signed)
 Encouraged diet and exercise for weight loss ?

## 2023-12-22 NOTE — Progress Notes (Signed)
 Subjective:    Patient ID: Jeremy Johns, male    DOB: 31-Oct-1960, 63 y.o.   MRN: [id number]  HPI  Patient presents to the clinic today for his annual exam.  Flu: never Tetanus: 11/2017 COVID: never Shingrix: never PSA screening: 12/2022 Colon screening: never Vision screening: annually Dentist: biannually  Diet: He does eat meat. He consumes fruits and veggies. He tries to avoid fried foods. He drinks mostly water, gatorade, soda Exercise: None  Review of Systems     Past Medical History:  Diagnosis Date  . Allergic rhinitis   . HTN (hypertension)   . LBP (low back pain)   . Osteoarthritis     Current Outpatient Medications  Medication Sig Dispense Refill  . Cholecalciferol (VITAMIN D3) 50 MCG (2000 UT) TABS Take by mouth.    . losartan  (COZAAR ) 50 MG tablet TAKE 1 AND 1/2 TABLETS DAILY BY MOUTH 135 tablet 1  . Multiple Vitamin (MULTIVITAMIN) tablet Take 1 tablet by mouth daily.    . tamsulosin  (FLOMAX ) 0.4 MG CAPS capsule Take 1 capsule (0.4 mg total) by mouth daily. 90 capsule 3   No current facility-administered medications for this visit.    No Known Allergies  Family History  Problem Relation Age of Onset  . Diabetes Mother   . Hypertension Mother   . Diabetes Father   . Hypertension Father   . Hypertension Unknown   . Diabetes Unknown     Social History   Socioeconomic History  . Marital status: Married    Spouse name: Not on file  . Number of children: Not on file  . Years of education: Not on file  . Highest education level: 12th grade  Occupational History  . Not on file  Tobacco Use  . Smoking status: Never  . Smokeless tobacco: Never  Substance and Sexual Activity  . Alcohol use: No  . Drug use: No  . Sexual activity: Yes  Other Topics Concern  . Not on file  Social History Narrative  . Not on file   Social Drivers of Health   Financial Resource Strain: Low Risk  (12/21/2023)   Overall Financial Resource Strain (CARDIA)   .  Difficulty of Paying Living Expenses: Not hard at all  Food Insecurity: No Food Insecurity (12/21/2023)   Hunger Vital Sign   . Worried About Programme researcher, broadcasting/film/video in the Last Year: Never true   . Ran Out of Food in the Last Year: Never true  Transportation Needs: No Transportation Needs (12/21/2023)   PRAPARE - Transportation   . Lack of Transportation (Medical): No   . Lack of Transportation (Non-Medical): No  Physical Activity: Insufficiently Active (12/21/2023)   Exercise Vital Sign   . Days of Exercise per Week: 3 days   . Minutes of Exercise per Session: 10 min  Stress: No Stress Concern Present (12/21/2023)   [employer] of Occupational Health - Occupational Stress Questionnaire   . Feeling of Stress: Not at all  Social Connections: Socially Isolated (12/21/2023)   Social Connection and Isolation Panel   . Frequency of Communication with Friends and Family: Once a week   . Frequency of Social Gatherings with Friends and Family: Once a week   . Attends Religious Services: Never   . Active Member of Clubs or Organizations: No   . Attends Banker Meetings: Not on file   . Marital Status: Married  Catering manager Violence: Not on file     Constitutional: Denies fever,  malaise, fatigue, headache or abrupt weight changes.  HEENT: Denies eye pain, eye redness, ear pain, ringing in the ears, wax buildup, runny nose, nasal congestion, bloody nose, or sore throat. Respiratory: Denies difficulty breathing, shortness of breath, cough or sputum production.   Cardiovascular: Denies chest pain, chest tightness, palpitations or swelling in the hands or feet.  Gastrointestinal: Denies abdominal pain, bloating, constipation, diarrhea or blood in the stool.  GU: Denies urgency, frequency, pain with urination, burning sensation, blood in urine, odor or discharge. Musculoskeletal: Patient reports joint pain.  Denies decrease in range of motion, difficulty with gait, muscle pain  or joint swelling.  Skin: Denies redness, rashes, lesions or ulcercations.  Neurological: Denies dizziness, difficulty with memory, difficulty with speech or problems with balance and coordination.  Psych: Denies anxiety, depression, SI/HI.  No other specific complaints in a complete review of systems (except as listed in HPI above).  Objective:   Physical Exam BP 134/82 (BP Location: Right Arm, Patient Position: Sitting, Cuff Size: Normal)   Ht 5' 7 (1.702 m)   Wt 173 lb 9.6 oz (78.7 kg)   BMI 27.19 kg/m    Wt Readings from Last 3 Encounters:  06/14/23 174 lb 3.2 oz (79 kg)  05/19/23 174 lb 6.4 oz (79.1 kg)  03/24/23 177 lb (80.3 kg)    General: Appears his stated age, overweight, in NAD. Skin: Warm, dry and intact.  HEENT: Head: normal shape and size; Eyes: sclera white, no icterus, conjunctiva pink, PERRLA and EOMs intact;  Neck:  Neck supple, trachea midline. No masses, lumps or thyromegaly present.  Cardiovascular: Normal rate and rhythm. S1,S2 noted.  No murmur, rubs or gallops noted. No JVD or BLE edema. No carotid bruits noted. Pulmonary/Chest: Normal effort and positive vesicular breath sounds. No respiratory distress. No wheezes, rales or ronchi noted.  Abdomen: Soft and nontender. Normal bowel sounds.  Musculoskeletal: Strength 5/5 BUE/BLE.  No difficulty with gait.  Neurological: Alert and oriented. Cranial nerves II-XII grossly intact. Coordination normal.  Psychiatric: Mood and affect normal. Behavior is normal. Judgment and thought content normal.     BMET    Component Value Date/Time   NA 138 06/14/2023 1549   K 4.4 06/14/2023 1549   CL 104 06/14/2023 1549   CO2 27 06/14/2023 1549   GLUCOSE 101 06/14/2023 1549   BUN 23 06/14/2023 1549   CREATININE 0.94 06/14/2023 1549   CALCIUM 9.4 06/14/2023 1549   GFRNONAA >60 05/17/2022 1621   GFRAA 104 07/11/2007 1017    Lipid Panel     Component Value Date/Time   CHOL 177 06/14/2023 1549   TRIG 99 06/14/2023  1549   HDL 50 06/14/2023 1549   CHOLHDL 3.5 06/14/2023 1549   VLDL 16.4 05/20/2020 0839   LDLCALC 108 (H) 06/14/2023 1549    CBC    Component Value Date/Time   WBC 8.6 06/14/2023 1549   RBC 5.06 06/14/2023 1549   HGB 15.5 06/14/2023 1549   HCT 44.6 06/14/2023 1549   PLT 276 06/14/2023 1549   MCV 88.1 06/14/2023 1549   MCH 30.6 06/14/2023 1549   MCHC 34.8 06/14/2023 1549   RDW 12.9 06/14/2023 1549   LYMPHSABS 2.5 01/10/2012 0841   MONOABS 0.5 01/10/2012 0841   EOSABS 0.1 01/10/2012 0841   BASOSABS 0.0 01/10/2012 0841    Hgb A1C Lab Results  Component Value Date   HGBA1C 6.0 (H) 06/14/2023           Assessment & Plan:   Preventative health  maintenance:  Flu shot declined Tetanus UTD Encouraged him to get his COVID-vaccine Discussed Shingrix vaccine, he will check coverage with his insurance company and schedule visit if he would like to have this done Cologuard previously ordered, advised him to complete this Encouraged him to consume a balanced diet and exercise regimen Advised him to see an eye doctor and dentist annually We will check CBC, c-Met, lipid, A1c and PSA today  RTC in 6 months, follow-up chronic conditions Angeline Laura, NP

## 2023-12-22 NOTE — Patient Instructions (Signed)
 Health Maintenance, Male  Adopting a healthy lifestyle and getting preventive care are important in promoting health and wellness. Ask your health care provider about:  The right schedule for you to have regular tests and exams.  Things you can do on your own to prevent diseases and keep yourself healthy.  What should I know about diet, weight, and exercise?  Eat a healthy diet    Eat a diet that includes plenty of vegetables, fruits, low-fat dairy products, and lean protein.  Do not eat a lot of foods that are high in solid fats, added sugars, or sodium.  Maintain a healthy weight  Body mass index (BMI) is a measurement that can be used to identify possible weight problems. It estimates body fat based on height and weight. Your health care provider can help determine your BMI and help you achieve or maintain a healthy weight.  Get regular exercise  Get regular exercise. This is one of the most important things you can do for your health. Most adults should:  Exercise for at least 150 minutes each week. The exercise should increase your heart rate and make you sweat (moderate-intensity exercise).  Do strengthening exercises at least twice a week. This is in addition to the moderate-intensity exercise.  Spend less time sitting. Even light physical activity can be beneficial.  Watch cholesterol and blood lipids  Have your blood tested for lipids and cholesterol at 62 years of age, then have this test every 5 years.  You may need to have your cholesterol levels checked more often if:  Your lipid or cholesterol levels are high.  You are older than 62 years of age.  You are at high risk for heart disease.  What should I know about cancer screening?  Many types of cancers can be detected early and may often be prevented. Depending on your health history and family history, you may need to have cancer screening at various ages. This may include screening for:  Colorectal cancer.  Prostate cancer.  Skin cancer.  Lung  cancer.  What should I know about heart disease, diabetes, and high blood pressure?  Blood pressure and heart disease  High blood pressure causes heart disease and increases the risk of stroke. This is more likely to develop in people who have high blood pressure readings or are overweight.  Talk with your health care provider about your target blood pressure readings.  Have your blood pressure checked:  Every 3-5 years if you are 24-52 years of age.  Every year if you are 3 years old or older.  If you are between the ages of 60 and 72 and are a current or former smoker, ask your health care provider if you should have a one-time screening for abdominal aortic aneurysm (AAA).  Diabetes  Have regular diabetes screenings. This checks your fasting blood sugar level. Have the screening done:  Once every three years after age 66 if you are at a normal weight and have a low risk for diabetes.  More often and at a younger age if you are overweight or have a high risk for diabetes.  What should I know about preventing infection?  Hepatitis B  If you have a higher risk for hepatitis B, you should be screened for this virus. Talk with your health care provider to find out if you are at risk for hepatitis B infection.  Hepatitis C  Blood testing is recommended for:  Everyone born from [birth year] through 1964.  Anyone  with known risk factors for hepatitis C.  Sexually transmitted infections (STIs)  You should be screened each year for STIs, including gonorrhea and chlamydia, if:  You are sexually active and are younger than 62 years of age.  You are older than 62 years of age and your health care provider tells you that you are at risk for this type of infection.  Your sexual activity has changed since you were last screened, and you are at increased risk for chlamydia or gonorrhea. Ask your health care provider if you are at risk.  Ask your health care provider about whether you are at high risk for HIV. Your health care provider  may recommend a prescription medicine to help prevent HIV infection. If you choose to take medicine to prevent HIV, you should first get tested for HIV. You should then be tested every 3 months for as long as you are taking the medicine.  Follow these instructions at home:  Alcohol use  Do not drink alcohol if your health care provider tells you not to drink.  If you drink alcohol:  Limit how much you have to 0-2 drinks a day.  Know how much alcohol is in your drink. In the [place] one drink equals one 12 oz bottle of beer (355 mL), one 5 oz glass of wine (148 mL), or one 1 oz glass of hard liquor (44 mL).  Lifestyle  Do not use any products that contain nicotine or tobacco. These products include cigarettes, chewing tobacco, and vaping devices, such as e-cigarettes. If you need help quitting, ask your health care provider.  Do not use street drugs.  Do not share needles.  Ask your health care provider for help if you need support or information about quitting drugs.  General instructions  Schedule regular health, dental, and eye exams.  Stay current with your vaccines.  Tell your health care provider if:  You often feel depressed.  You have ever been abused or do not feel safe at home.  Summary  Adopting a healthy lifestyle and getting preventive care are important in promoting health and wellness.  Follow your health care provider's instructions about healthy diet, exercising, and getting tested or screened for diseases.  Follow your health care provider's instructions on monitoring your cholesterol and blood pressure.  This information is not intended to replace advice given to you by your health care provider. Make sure you discuss any questions you have with your health care provider.  Document Revised: 07/14/2020 Document Reviewed: 07/14/2020  Elsevier Patient Education  2024 [employer].

## 2023-12-23 ENCOUNTER — Ambulatory Visit: Payer: Self-pay | Admitting: Internal Medicine

## 2023-12-23 LAB — COMPREHENSIVE METABOLIC PANEL WITH GFR
AG Ratio: 2 (calc) (ref 1.0–2.5)
ALT: 17 U/L (ref 9–46)
AST: 14 U/L (ref 10–35)
Albumin: 4.8 g/dL (ref 3.6–5.1)
Alkaline phosphatase (APISO): 54 U/L (ref 35–144)
BUN: 22 mg/dL (ref 7–25)
CO2: 25 mmol/L (ref 20–32)
Calcium: 9.8 mg/dL (ref 8.6–10.3)
Chloride: 105 mmol/L (ref 98–110)
Creat: 1.01 mg/dL (ref 0.70–1.35)
Globulin: 2.4 g/dL (ref 1.9–3.7)
Glucose, Bld: 90 mg/dL (ref 65–99)
Potassium: 4.6 mmol/L (ref 3.5–5.3)
Sodium: 139 mmol/L (ref 135–146)
Total Bilirubin: 0.4 mg/dL (ref 0.2–1.2)
Total Protein: 7.2 g/dL (ref 6.1–8.1)
eGFR: 85 mL/min/1.73m2 (ref >=60)

## 2023-12-23 LAB — CBC
HCT: 48.8 % (ref 38.5–50.0)
Hemoglobin: 16.4 g/dL (ref 13.2–17.1)
MCH: 30.2 pg (ref 27.0–33.0)
MCHC: 33.6 g/dL (ref 32.0–36.0)
MCV: 89.9 fL (ref 80.0–100.0)
MPV: 10.5 fL (ref 7.5–12.5)
Platelets: 302 Thousand/uL (ref 140–400)
RBC: 5.43 Million/uL (ref 4.20–5.80)
RDW: 12 % (ref 11.0–15.0)
WBC: 9.5 Thousand/uL (ref 3.8–10.8)

## 2023-12-23 LAB — HEMOGLOBIN A1C
Hgb A1c MFr Bld: 5.7 % — ABNORMAL HIGH (ref <5.7)
Mean Plasma Glucose: 117 mg/dL
eAG (mmol/L): 6.5 mmol/L

## 2023-12-23 LAB — LIPID PANEL
Cholesterol: 168 mg/dL (ref <200)
HDL: 50 mg/dL (ref >=40)
LDL Cholesterol (Calc): 95 mg/dL
Non-HDL Cholesterol (Calc): 118 mg/dL (ref <130)
Total CHOL/HDL Ratio: 3.4 (calc) (ref <5.0)
Triglycerides: 131 mg/dL (ref <150)

## 2023-12-23 LAB — PSA: PSA: 4.38 ng/mL — ABNORMAL HIGH (ref <=4.00)

## 2024-03-01 ENCOUNTER — Other Ambulatory Visit: Payer: Self-pay | Admitting: Urology

## 2024-03-01 DIAGNOSIS — N401 Enlarged prostate with lower urinary tract symptoms: Secondary | ICD-10-CM

## 2024-03-28 ENCOUNTER — Other Ambulatory Visit: Payer: Self-pay | Admitting: Urology

## 2024-03-28 DIAGNOSIS — N401 Enlarged prostate with lower urinary tract symptoms: Secondary | ICD-10-CM

## 2024-04-11 ENCOUNTER — Encounter: Payer: Self-pay | Admitting: Internal Medicine

## 2024-04-11 ENCOUNTER — Ambulatory Visit: Admitting: Internal Medicine

## 2024-04-11 VITALS — BP 134/88 | HR 79 | Ht 67.0 in | Wt 179.0 lb

## 2024-04-11 DIAGNOSIS — J0110 Acute frontal sinusitis, unspecified: Secondary | ICD-10-CM

## 2024-04-11 MED ORDER — AMOXICILLIN-POT CLAVULANATE 875-125 MG PO TABS: 1.0000 | ORAL_TABLET | Freq: Two times a day (BID) | ORAL | 0 refills | Status: AC

## 2024-04-11 MED ORDER — PREDNISONE 10 MG PO TABS: ORAL_TABLET | ORAL | 0 refills | Status: AC

## 2024-04-11 NOTE — Progress Notes (Unsigned)
 "  Subjective:    Patient ID: Jeremy Johns, male    DOB: September 12, 1960, 64 y.o.   MRN: [id number]  HPI  Discussed the use of AI scribe software for clinical note transcription with the patient, who gave verbal consent to proceed.  Jeremy Johns is a 64 year old male who presents with congestion and sinus pressure.  He has been experiencing congestion and sinus pressure for about two weeks. Initially, the symptoms began with congestion, which improved slightly after a week but worsened again last Friday. He describes a 'stopped up head' and occasional cough, but no shortness of breath. A scratchy throat was present for a couple of days earlier in the course, which has since resolved.  He has been taking Mucinex at night for the past three days, which has helped him sleep but has not alleviated his symptoms. He also takes Fareston. No fever, chills, or body aches are present. Drainage sometimes causes his stomach to feel unsettled, though not sick.  In March, he had a similar episode that progressed to his chest, for which he was treated with Augmentin  and prednisone , which he recalls was effective.        Review of Systems     Past Medical History:  Diagnosis Date   Allergic rhinitis    Cataract    HTN (hypertension)    LBP (low back pain)    Osteoarthritis     Current Outpatient Medications  Medication Sig Dispense Refill   Cholecalciferol (VITAMIN D3) 50 MCG (2000 UT) TABS Take by mouth.     losartan  (COZAAR ) 50 MG tablet TAKE 1 AND 1/2 TABLETS DAILY BY MOUTH 135 tablet 1   Multiple Vitamin (MULTIVITAMIN) tablet Take 1 tablet by mouth daily.     tamsulosin  (FLOMAX ) 0.4 MG CAPS capsule TAKE 1 CAPSULE BY MOUTH EVERY DAY 90 capsule 1   No current facility-administered medications for this visit.    No Known Allergies  Family History  Problem Relation Age of Onset   Diabetes Mother    Hypertension Mother    Diabetes Father    Hypertension Father    Hypertension Unknown     Diabetes Unknown     Social History   Socioeconomic History   Marital status: Married    Spouse name: Not on file   Number of children: Not on file   Years of education: Not on file   Highest education level: 12th grade  Occupational History   Not on file  Tobacco Use   Smoking status: Never   Smokeless tobacco: Never  Substance and Sexual Activity   Alcohol use: No   Drug use: No   Sexual activity: Not Currently    Birth control/protection: None  Other Topics Concern   Not on file  Social History Narrative   Not on file   Social Drivers of Health   Tobacco Use: Low Risk (12/22/2023)   Patient History    Smoking Tobacco Use: Never    Smokeless Tobacco Use: Never    Passive Exposure: Not on file  Financial Resource Strain: Low Risk (12/21/2023)   Overall Financial Resource Strain (CARDIA)    Difficulty of Paying Living Expenses: Not hard at all  Food Insecurity: No Food Insecurity (12/21/2023)   Epic    Worried About Radiation Protection Practitioner of Food in the Last Year: Never true    Ran Out of Food in the Last Year: Never true  Transportation Needs: No Transportation Needs (12/21/2023)   Epic  Lack of Transportation (Medical): No    Lack of Transportation (Non-Medical): No  Physical Activity: Insufficiently Active (12/21/2023)   Exercise Vital Sign    Days of Exercise per Week: 3 days    Minutes of Exercise per Session: 10 min  Stress: No Stress Concern Present (12/21/2023)   [employer] of Occupational Health - Occupational Stress Questionnaire    Feeling of Stress: Not at all  Social Connections: Socially Isolated (12/21/2023)   Social Connection and Isolation Panel    Frequency of Communication with Friends and Family: Once a week    Frequency of Social Gatherings with Friends and Family: Once a week    Attends Religious Services: Never    Database Administrator or Organizations: No    Attends Engineer, Structural: Not on file    Marital Status:  Married  Catering Manager Violence: Not on file  Depression (PHQ2-9): Low Risk (12/22/2023)   Depression (PHQ2-9)    PHQ-2 Score: 0  Alcohol Screen: Low Risk (12/21/2023)   Alcohol Screen    Last Alcohol Screening Score (AUDIT): 2  Housing: Low Risk (12/21/2023)   Epic    Unable to Pay for Housing in the Last Year: No    Number of Times Moved in the Last Year: 0    Homeless in the Last Year: No  Utilities: Not on file  Health Literacy: Not on file    Constitutional: Pt reports headache. Denies fever, malaise, fatigue, or abrupt weight changes.  HEENT: Pt reports sinus pressure, nasal congestion and sore throat. Denies eye pain, eye redness, ringing in the ears, wax buildup, runny nose, ear pain, bloody nose. Respiratory: Pt reports cough. Denies difficulty breathing or shortness of breath.   Cardiovascular: Denies chest pain, chest tightness, palpitations or swelling in the hands or feet.  Gastrointestinal: Denies abdominal pain, bloating, constipation, diarrhea or blood in the stool.  Musculoskeletal: Denies decrease in range of motion, difficulty with gait, muscle pain or joint pain and swelling.  Neurological: Denies dizziness, difficulty with memory, difficulty with speech or problems with balance and coordination.  No other specific complaints in a complete review of systems (except as listed in HPI above).  Objective:   BP 134/88 (BP Location: Right Arm, Patient Position: Sitting, Cuff Size: Normal)   Pulse 79   Ht 5' 7 (1.702 m)   Wt 179 lb (81.2 kg)   SpO2 96%   BMI 28.04 kg/m    Wt Readings from Last 3 Encounters:  12/22/23 173 lb 9.6 oz (78.7 kg)  06/14/23 174 lb 3.2 oz (79 kg)  05/19/23 174 lb 6.4 oz (79.1 kg)    General: Appears his stated age, appears unwell, but in NAD. Skin: Warm, dry and intact.  HEENT: Head: normal shape and size, frontal sinus tenderness noted; Eyes: sclera white, no icterus, conjunctiva pink, PERRLA and EOMs intact; Throat/Mouth:  Teeth present, mucosa erythema this and moist, + PND, no exudate, lesions or ulcerations noted.  Neck: No adenopathy noted. Cardiovascular: Normal rate and rhythm.  Pulmonary/Chest: Normal effort and positive vesicular breath sounds. No respiratory distress. No wheezes, rales or ronchi noted.  Neurological: Alert and oriented.     BMET    Component Value Date/Time   NA 139 12/22/2023 1551   K 4.6 12/22/2023 1551   CL 105 12/22/2023 1551   CO2 25 12/22/2023 1551   GLUCOSE 90 12/22/2023 1551   BUN 22 12/22/2023 1551   CREATININE 1.01 12/22/2023 1551   CALCIUM 9.8 12/22/2023 1551  GFRNONAA >60 05/17/2022 1621   GFRAA 104 07/11/2007 1017    Lipid Panel     Component Value Date/Time   CHOL 168 12/22/2023 1551   TRIG 131 12/22/2023 1551   HDL 50 12/22/2023 1551   CHOLHDL 3.4 12/22/2023 1551   VLDL 16.4 05/20/2020 0839   LDLCALC 95 12/22/2023 1551    CBC    Component Value Date/Time   WBC 9.5 12/22/2023 1551   RBC 5.43 12/22/2023 1551   HGB 16.4 12/22/2023 1551   HCT 48.8 12/22/2023 1551   PLT 302 12/22/2023 1551   MCV 89.9 12/22/2023 1551   MCH 30.2 12/22/2023 1551   MCHC 33.6 12/22/2023 1551   RDW 12.0 12/22/2023 1551   LYMPHSABS 2.5 01/10/2012 0841   MONOABS 0.5 01/10/2012 0841   EOSABS 0.1 01/10/2012 0841   BASOSABS 0.0 01/10/2012 0841    Hgb A1C Lab Results  Component Value Date   HGBA1C 5.7 (H) 12/22/2023           Assessment & Plan:   Assessment and Plan    Acute frontal sinusitis Symptoms consistent with sinus infection. No progression to lungs. Previous similar infection responded to Augmentin  and prednisone . - Prescribed Augmentin  875-125 mg BID x 10 days, to start tonight. - Prescribed 10 mg prednisone  taper to start tomorrow morning.  - Can use a Nettie pot which can be purchased from your local pharmacy       RTC in 2 months, follow-up chronic conditions Angeline Laura, NP  "

## 2024-04-11 NOTE — Patient Instructions (Signed)

## 2024-06-18 ENCOUNTER — Ambulatory Visit: Admitting: Internal Medicine
